# Patient Record
Sex: Female | Born: 1950
Health system: Southern US, Community
[De-identification: ages and names within clinical notes are randomized; demographics above are authoritative.]

## PROBLEM LIST (undated history)

## (undated) DIAGNOSIS — N200 Calculus of kidney: Secondary | ICD-10-CM

## (undated) DIAGNOSIS — E785 Hyperlipidemia, unspecified: Secondary | ICD-10-CM

## (undated) DIAGNOSIS — K5792 Diverticulitis of intestine, part unspecified, without perforation or abscess without bleeding: Secondary | ICD-10-CM

## (undated) DIAGNOSIS — N83209 Unspecified ovarian cyst, unspecified side: Secondary | ICD-10-CM

## (undated) DIAGNOSIS — R112 Nausea with vomiting, unspecified: Secondary | ICD-10-CM

## (undated) DIAGNOSIS — E079 Disorder of thyroid, unspecified: Secondary | ICD-10-CM

## (undated) DIAGNOSIS — Z9889 Other specified postprocedural states: Secondary | ICD-10-CM

## (undated) DIAGNOSIS — I341 Nonrheumatic mitral (valve) prolapse: Secondary | ICD-10-CM

## (undated) DIAGNOSIS — Z87442 Personal history of urinary calculi: Secondary | ICD-10-CM

## (undated) DIAGNOSIS — E039 Hypothyroidism, unspecified: Secondary | ICD-10-CM

## (undated) HISTORY — PX: BREAST EXCISIONAL BIOPSY: SUR124

## (undated) HISTORY — PX: APPENDECTOMY: SHX54

## (undated) HISTORY — PX: TONSILLECTOMY: SUR1361

## (undated) HISTORY — DX: Hyperlipidemia, unspecified: E78.5

## (undated) HISTORY — DX: Diverticulitis of intestine, part unspecified, without perforation or abscess without bleeding: K57.92

## (undated) HISTORY — PX: CATARACT EXTRACTION, BILATERAL: SHX1313

## (undated) HISTORY — DX: Hypothyroidism, unspecified: E03.9

## (undated) HISTORY — DX: Nonrheumatic mitral (valve) prolapse: I34.1

## (undated) HISTORY — PX: OVARIAN CYST REMOVAL: SHX89

## (undated) HISTORY — PX: TUBAL LIGATION: SHX77

## (undated) HISTORY — DX: Calculus of kidney: N20.0

## (undated) HISTORY — DX: Disorder of thyroid, unspecified: E07.9

## (undated) HISTORY — DX: Unspecified ovarian cyst, unspecified side: N83.209

---

## 1998-01-25 ENCOUNTER — Other Ambulatory Visit: Admission: RE | Admit: 1998-01-25 | Discharge: 1998-01-25 | Payer: Self-pay | Admitting: Gynecology

## 1998-06-30 ENCOUNTER — Other Ambulatory Visit: Admission: RE | Admit: 1998-06-30 | Discharge: 1998-06-30 | Payer: Self-pay | Admitting: Gynecology

## 1998-08-12 ENCOUNTER — Other Ambulatory Visit: Admission: RE | Admit: 1998-08-12 | Discharge: 1998-08-12 | Payer: Self-pay | Admitting: Gynecology

## 1998-12-28 ENCOUNTER — Other Ambulatory Visit: Admission: RE | Admit: 1998-12-28 | Discharge: 1998-12-28 | Payer: Self-pay | Admitting: Gynecology

## 1999-06-08 ENCOUNTER — Other Ambulatory Visit: Admission: RE | Admit: 1999-06-08 | Discharge: 1999-06-08 | Payer: Self-pay | Admitting: Gynecology

## 1999-07-22 ENCOUNTER — Ambulatory Visit (HOSPITAL_COMMUNITY): Admission: RE | Admit: 1999-07-22 | Discharge: 1999-07-22 | Payer: Self-pay | Admitting: Gynecology

## 1999-07-22 ENCOUNTER — Encounter (INDEPENDENT_AMBULATORY_CARE_PROVIDER_SITE_OTHER): Payer: Self-pay | Admitting: Specialist

## 1999-08-31 ENCOUNTER — Encounter: Admission: RE | Admit: 1999-08-31 | Discharge: 1999-08-31 | Payer: Self-pay | Admitting: Gynecology

## 1999-08-31 ENCOUNTER — Encounter: Payer: Self-pay | Admitting: Gynecology

## 1999-09-19 ENCOUNTER — Other Ambulatory Visit: Admission: RE | Admit: 1999-09-19 | Discharge: 1999-09-19 | Payer: Self-pay | Admitting: Gynecology

## 2000-02-29 ENCOUNTER — Encounter: Admission: RE | Admit: 2000-02-29 | Discharge: 2000-04-04 | Payer: Self-pay | Admitting: Family Medicine

## 2000-03-19 ENCOUNTER — Other Ambulatory Visit: Admission: RE | Admit: 2000-03-19 | Discharge: 2000-03-19 | Payer: Self-pay | Admitting: Gynecology

## 2000-09-18 ENCOUNTER — Other Ambulatory Visit: Admission: RE | Admit: 2000-09-18 | Discharge: 2000-09-18 | Payer: Self-pay | Admitting: Gynecology

## 2001-10-07 ENCOUNTER — Other Ambulatory Visit: Admission: RE | Admit: 2001-10-07 | Discharge: 2001-10-07 | Payer: Self-pay | Admitting: Gynecology

## 2001-10-14 ENCOUNTER — Encounter: Payer: Self-pay | Admitting: Gynecology

## 2001-10-14 ENCOUNTER — Encounter: Admission: RE | Admit: 2001-10-14 | Discharge: 2001-10-14 | Payer: Self-pay | Admitting: Gynecology

## 2003-01-15 ENCOUNTER — Other Ambulatory Visit: Admission: RE | Admit: 2003-01-15 | Discharge: 2003-01-15 | Payer: Self-pay | Admitting: Gynecology

## 2004-01-01 ENCOUNTER — Encounter: Admission: RE | Admit: 2004-01-01 | Discharge: 2004-01-01 | Payer: Self-pay | Admitting: Gynecology

## 2004-01-11 ENCOUNTER — Other Ambulatory Visit: Admission: RE | Admit: 2004-01-11 | Discharge: 2004-01-11 | Payer: Self-pay | Admitting: Gynecology

## 2005-03-20 ENCOUNTER — Other Ambulatory Visit: Admission: RE | Admit: 2005-03-20 | Discharge: 2005-03-20 | Payer: Self-pay | Admitting: Gynecology

## 2005-03-23 ENCOUNTER — Encounter: Admission: RE | Admit: 2005-03-23 | Discharge: 2005-03-23 | Payer: Self-pay | Admitting: Gynecology

## 2005-03-23 ENCOUNTER — Encounter: Payer: Self-pay | Admitting: Family Medicine

## 2005-07-21 ENCOUNTER — Ambulatory Visit: Payer: Self-pay | Admitting: Gastroenterology

## 2005-08-09 ENCOUNTER — Ambulatory Visit: Payer: Self-pay | Admitting: Gastroenterology

## 2006-04-04 ENCOUNTER — Encounter: Admission: RE | Admit: 2006-04-04 | Discharge: 2006-04-04 | Payer: Self-pay | Admitting: Gynecology

## 2006-04-11 ENCOUNTER — Other Ambulatory Visit: Admission: RE | Admit: 2006-04-11 | Discharge: 2006-04-11 | Payer: Self-pay | Admitting: Gynecology

## 2006-07-05 ENCOUNTER — Ambulatory Visit: Payer: Self-pay | Admitting: Family Medicine

## 2006-09-07 ENCOUNTER — Ambulatory Visit: Payer: Self-pay | Admitting: Family Medicine

## 2006-10-01 ENCOUNTER — Ambulatory Visit: Payer: Self-pay | Admitting: Family Medicine

## 2006-10-01 LAB — CONVERTED CEMR LAB
ALT: 45 units/L — ABNORMAL HIGH (ref 0–40)
AST: 46 units/L — ABNORMAL HIGH (ref 0–37)
Albumin: 4.3 g/dL (ref 3.5–5.2)
Alkaline Phosphatase: 172 units/L — ABNORMAL HIGH (ref 39–117)
BUN: 22 mg/dL (ref 6–23)
Basophils Absolute: 0.1 10*3/uL (ref 0.0–0.1)
Basophils Relative: 0.9 % (ref 0.0–1.0)
CO2: 33 meq/L — ABNORMAL HIGH (ref 19–32)
Calcium: 10.2 mg/dL (ref 8.4–10.5)
Chloride: 105 meq/L (ref 96–112)
Cholesterol: 222 mg/dL (ref 0–200)
Creatinine, Ser: 0.9 mg/dL (ref 0.4–1.2)
Direct LDL: 124.9 mg/dL
Eosinophils Relative: 2.3 % (ref 0.0–5.0)
GFR calc Af Amer: 83 mL/min
GFR calc non Af Amer: 69 mL/min
Glucose, Bld: 96 mg/dL (ref 70–99)
HCT: 41 % (ref 36.0–46.0)
HDL: 64.5 mg/dL (ref >39.0)
Hemoglobin: 14.5 g/dL (ref 12.0–15.0)
Lymphocytes Relative: 30.4 % (ref 12.0–46.0)
MCHC: 35.3 g/dL (ref 30.0–36.0)
MCV: 89 fL (ref 78.0–100.0)
Monocytes Absolute: 0.6 10*3/uL (ref 0.2–0.7)
Monocytes Relative: 7.1 % (ref 3.0–11.0)
Neutro Abs: 4.6 10*3/uL (ref 1.4–7.7)
Neutrophils Relative %: 59.3 % (ref 43.0–77.0)
Platelets: 322 10*3/uL (ref 150–400)
Potassium: 4.4 meq/L (ref 3.5–5.1)
RBC: 4.6 M/uL (ref 3.87–5.11)
RDW: 12.2 % (ref 11.5–14.6)
Sodium: 144 meq/L (ref 135–145)
TSH: 3.66 microintl units/mL (ref 0.35–5.50)
Total Bilirubin: 0.8 mg/dL (ref 0.3–1.2)
Total CHOL/HDL Ratio: 3.4
Total Protein: 7.1 g/dL (ref 6.0–8.3)
Triglycerides: 184 mg/dL — ABNORMAL HIGH (ref 0–149)
VLDL: 37 mg/dL (ref 0–40)
WBC: 7.9 10*3/uL (ref 4.5–10.5)

## 2006-10-05 ENCOUNTER — Ambulatory Visit: Payer: Self-pay | Admitting: Family Medicine

## 2006-10-05 LAB — CONVERTED CEMR LAB
ALT: 36 units/L (ref 0–40)
AST: 35 units/L (ref 0–37)
Albumin: 4.1 g/dL (ref 3.5–5.2)
Alkaline Phosphatase: 169 units/L — ABNORMAL HIGH (ref 39–117)
BUN: 21 mg/dL (ref 6–23)
CO2: 30 meq/L (ref 19–32)
Calcium: 10 mg/dL (ref 8.4–10.5)
Chloride: 103 meq/L (ref 96–112)
Creatinine, Ser: 0.8 mg/dL (ref 0.4–1.2)
GFR calc Af Amer: 95 mL/min
GFR calc non Af Amer: 79 mL/min
Glucose, Bld: 96 mg/dL (ref 70–99)
Potassium: 4.7 meq/L (ref 3.5–5.1)
Sodium: 140 meq/L (ref 135–145)
Total Bilirubin: 0.9 mg/dL (ref 0.3–1.2)
Total Protein: 6.6 g/dL (ref 6.0–8.3)

## 2006-12-18 ENCOUNTER — Ambulatory Visit: Payer: Self-pay | Admitting: Family Medicine

## 2006-12-18 LAB — CONVERTED CEMR LAB
ALT: 36 units/L (ref 0–40)
AST: 34 units/L (ref 0–37)
Albumin: 3.9 g/dL (ref 3.5–5.2)
Alkaline Phosphatase: 110 units/L (ref 39–117)
Bilirubin, Direct: 0.1 mg/dL (ref 0.0–0.3)
Total Bilirubin: 0.7 mg/dL (ref 0.3–1.2)
Total Protein: 6.3 g/dL (ref 6.0–8.3)

## 2007-04-11 ENCOUNTER — Encounter: Admission: RE | Admit: 2007-04-11 | Discharge: 2007-04-11 | Payer: Self-pay | Admitting: Gynecology

## 2007-04-29 ENCOUNTER — Other Ambulatory Visit: Admission: RE | Admit: 2007-04-29 | Discharge: 2007-04-29 | Payer: Self-pay | Admitting: Gynecology

## 2007-11-08 ENCOUNTER — Ambulatory Visit: Payer: Self-pay | Admitting: Family Medicine

## 2007-11-11 ENCOUNTER — Telehealth: Payer: Self-pay | Admitting: Family Medicine

## 2007-11-22 ENCOUNTER — Telehealth (INDEPENDENT_AMBULATORY_CARE_PROVIDER_SITE_OTHER): Payer: Self-pay | Admitting: *Deleted

## 2007-12-06 ENCOUNTER — Encounter: Payer: Self-pay | Admitting: Family Medicine

## 2008-01-07 ENCOUNTER — Ambulatory Visit: Payer: Self-pay | Admitting: Gastroenterology

## 2008-01-08 ENCOUNTER — Ambulatory Visit: Payer: Self-pay | Admitting: Cardiovascular Disease

## 2008-01-15 ENCOUNTER — Telehealth: Payer: Self-pay | Admitting: Gastroenterology

## 2008-04-13 ENCOUNTER — Encounter: Admission: RE | Admit: 2008-04-13 | Discharge: 2008-04-13 | Payer: Self-pay | Admitting: Gynecology

## 2008-05-23 DIAGNOSIS — Z888 Allergy status to other drugs, medicaments and biological substances status: Secondary | ICD-10-CM | POA: Insufficient documentation

## 2008-05-25 ENCOUNTER — Ambulatory Visit: Payer: Self-pay | Admitting: Family Medicine

## 2008-05-26 ENCOUNTER — Telehealth: Payer: Self-pay | Admitting: Family Medicine

## 2009-04-30 ENCOUNTER — Encounter: Admission: RE | Admit: 2009-04-30 | Discharge: 2009-04-30 | Payer: Self-pay | Admitting: Gynecology

## 2009-04-30 LAB — HM MAMMOGRAPHY

## 2009-06-22 ENCOUNTER — Ambulatory Visit: Payer: Self-pay | Admitting: Family Medicine

## 2009-07-26 ENCOUNTER — Ambulatory Visit: Payer: Self-pay | Admitting: Family Medicine

## 2009-07-26 DIAGNOSIS — J309 Allergic rhinitis, unspecified: Secondary | ICD-10-CM | POA: Insufficient documentation

## 2009-10-19 ENCOUNTER — Encounter: Payer: Self-pay | Admitting: Family Medicine

## 2010-01-14 ENCOUNTER — Telehealth: Payer: Self-pay | Admitting: Family Medicine

## 2010-05-19 ENCOUNTER — Encounter: Admission: RE | Admit: 2010-05-19 | Discharge: 2010-05-19 | Payer: Self-pay | Admitting: Gynecology

## 2010-06-22 LAB — HM DEXA SCAN

## 2010-10-11 NOTE — Progress Notes (Signed)
Summary: FYI  Phone Note Call from Patient   Caller: Patient Call For: Roderick Pee MD Summary of Call: Pt wants Dr. Tawanna Cooler to know she was diagnosed with kidney stones at [facility]. hospital this week. [phone] Initial call taken by: Lynann Beaver CMA,  Jan 14, 2010 11:26 AM  Follow-up for Phone Call        Provider Notified Follow-up by: Roderick Pee MD,  Jan 14, 2010 11:35 AM

## 2010-10-11 NOTE — Miscellaneous (Signed)
Summary: dm eye exam  Clinical Lists Changes  Observations: Added new observation of EYES COMMENT: 10/2010 (10/19/2009 17:14) Added new observation of EYE EXAM BY: [facility] [facility] (10/18/2009 17:15) Added new observation of DMEYEEXMRES: normal (10/18/2009 17:15) Added new observation of DIAB EYE EX: normal (10/18/2009 17:15)      Diabetes Management History:      She says that she is exercising.    Diabetes Management Exam:    Eye Exam:       Eye Exam done elsewhere          Date: 10/18/2009          Results: normal          Done by: Maggie Schwalbe

## 2010-11-09 ENCOUNTER — Encounter: Payer: Self-pay | Admitting: Family Medicine

## 2010-11-23 ENCOUNTER — Encounter: Payer: Self-pay | Admitting: Family Medicine

## 2010-11-24 ENCOUNTER — Ambulatory Visit (INDEPENDENT_AMBULATORY_CARE_PROVIDER_SITE_OTHER): Payer: BC Managed Care – PPO | Admitting: Family Medicine

## 2010-11-24 ENCOUNTER — Encounter: Payer: Self-pay | Admitting: Family Medicine

## 2010-11-24 DIAGNOSIS — I059 Rheumatic mitral valve disease, unspecified: Secondary | ICD-10-CM

## 2010-11-24 DIAGNOSIS — E119 Type 2 diabetes mellitus without complications: Secondary | ICD-10-CM

## 2010-11-24 DIAGNOSIS — E785 Hyperlipidemia, unspecified: Secondary | ICD-10-CM

## 2010-11-24 DIAGNOSIS — I341 Nonrheumatic mitral (valve) prolapse: Secondary | ICD-10-CM

## 2010-11-24 DIAGNOSIS — E039 Hypothyroidism, unspecified: Secondary | ICD-10-CM

## 2010-11-24 LAB — CBC WITH DIFFERENTIAL/PLATELET
Basophils Relative: 0.7 % (ref 0.0–3.0)
Eosinophils Relative: 3.5 % (ref 0.0–5.0)
HCT: 43.5 % (ref 36.0–46.0)
Lymphs Abs: 1.8 10*3/uL (ref 0.7–4.0)
MCV: 90.9 fl (ref 78.0–100.0)
Monocytes Absolute: 0.5 10*3/uL (ref 0.1–1.0)
RBC: 4.78 Mil/uL (ref 3.87–5.11)
WBC: 6.9 10*3/uL (ref 4.5–10.5)

## 2010-11-24 LAB — POCT URINALYSIS DIPSTICK
Glucose, UA: NEGATIVE
Ketones, UA: NEGATIVE
Protein, UA: NEGATIVE
Spec Grav, UA: 1.02
Urobilinogen, UA: 1
pH, UA: 7

## 2010-11-24 LAB — BASIC METABOLIC PANEL
Chloride: 106 mEq/L (ref 96–112)
Potassium: 4.4 mEq/L (ref 3.5–5.1)
Sodium: 142 mEq/L (ref 135–145)

## 2010-11-24 LAB — LIPID PANEL
Cholesterol: 176 mg/dL (ref 0–200)
Total CHOL/HDL Ratio: 3
Triglycerides: 103 mg/dL (ref 0.0–149.0)

## 2010-11-24 LAB — HEPATIC FUNCTION PANEL
ALT: 31 U/L (ref 0–35)
AST: 36 U/L (ref 0–37)
Bilirubin, Direct: 0.2 mg/dL (ref 0.0–0.3)
Total Protein: 6.9 g/dL (ref 6.0–8.3)

## 2010-11-24 LAB — HEMOGLOBIN A1C: Hgb A1c MFr Bld: 6 % (ref 4.6–6.5)

## 2010-11-24 NOTE — Progress Notes (Signed)
  Subjective:    Patient ID: Angela Houston, female    DOB: May 27, 1951, 60 y.o.   MRN: [id number]  HPI Angela Houston is a 60 year old, married female, nonsmoker, who comes in today for evaluation of  M. VP.  She has a history of mitral valve prolapse, asymptomatic.  She has a history of underlying hyperlipidemia, diabetes, and hypothyroidism, but are currently being managed by an OB/GYN.  I recommend we do a complete medical evaluation here.!!!!!!!!!!!!!!!   Review of Systems    .  Negative Objective:   Physical Exam    Well-developed well-nourished, female, in no acute distress.  Cardiac exam negative no click, no murmur    Assessment & Plan:  Mitral valve prolapse, asymptomatic, normal exam.    History of diabetes, hyperlipidemia, and hypothyroidism.  Return for CPX

## 2010-11-24 NOTE — Patient Instructions (Signed)
Set up a 30 minute appointment sometime in the next two to 3 weeks to review.  All your medical problems

## 2010-11-25 LAB — MICROALBUMIN / CREATININE URINE RATIO: Microalb Creat Ratio: 0.7 mg/g (ref 0.0–30.0)

## 2010-12-05 ENCOUNTER — Ambulatory Visit (INDEPENDENT_AMBULATORY_CARE_PROVIDER_SITE_OTHER): Payer: BC Managed Care – PPO | Admitting: Family Medicine

## 2010-12-05 ENCOUNTER — Encounter: Payer: Self-pay | Admitting: Family Medicine

## 2010-12-05 DIAGNOSIS — I059 Rheumatic mitral valve disease, unspecified: Secondary | ICD-10-CM

## 2010-12-05 DIAGNOSIS — I341 Nonrheumatic mitral (valve) prolapse: Secondary | ICD-10-CM

## 2010-12-05 DIAGNOSIS — E039 Hypothyroidism, unspecified: Secondary | ICD-10-CM

## 2010-12-05 DIAGNOSIS — E119 Type 2 diabetes mellitus without complications: Secondary | ICD-10-CM

## 2010-12-05 DIAGNOSIS — E785 Hyperlipidemia, unspecified: Secondary | ICD-10-CM

## 2010-12-05 DIAGNOSIS — Z23 Encounter for immunization: Secondary | ICD-10-CM

## 2010-12-05 MED ORDER — METFORMIN HCL 500 MG PO TABS: ORAL_TABLET | ORAL | Status: DC

## 2010-12-05 NOTE — Patient Instructions (Signed)
Stop the Lipitor, and we will check a fasting lipid panel in two months.  Decrease the thyroid to one tablet Monday through Friday skip Saturday and Sunday.  Decrease the metformin to 500 mg prior to breakfast.  Follow-up BMP and A1c in two months

## 2010-12-05 NOTE — Progress Notes (Signed)
  Subjective:    Patient ID: Angela Houston, female    DOB: [birth year], 60 y.o.   MRN: [id number]  HPI Angela Houston is a 60 year old, married female, G3, P3, who comes in today for evaluation of hyperlipidemia, hypothyroidism, and diabetes, type II.  She was placed on Glucophage 500 mg 4 times a day by her gynecologist.  However, her A1c is 6.0.  Blood sugars are all normal.  No family history of diabetes.  She is thin in stature.  She was placed on Lipitor 20 nightly however, her lipid panel is normal off medication.  Her TSH is .35, therefore, she needs to cut down on her thyroid dose   Review of Systems  Constitutional: Negative.   HENT: Negative.   Eyes: Negative.   Respiratory: Negative.   Cardiovascular: Negative.   Gastrointestinal: Negative.   Genitourinary: Negative.   Musculoskeletal: Negative.   Neurological: Negative.   Hematological: Negative.   Psychiatric/Behavioral: Negative.        Objective:   Physical Exam  Constitutional: She appears well-developed and well-nourished.  HENT:  Head: Normocephalic and atraumatic.  Right Ear: External ear normal.  Left Ear: External ear normal.  Nose: Nose normal.  Mouth/Throat: Oropharynx is clear and moist.  Eyes: EOM are normal. Pupils are equal, round, and reactive to light.  Neck: Normal range of motion. Neck supple. No thyromegaly present.  Cardiovascular: Normal rate, regular rhythm, normal heart sounds and intact distal pulses.  Exam reveals no gallop and no friction rub.   No murmur heard. Pulmonary/Chest: Effort normal and breath sounds normal.  Abdominal: Soft. Bowel sounds are normal. She exhibits no distension and no mass. There is no tenderness. There is no rebound.  Musculoskeletal: Normal range of motion.  Lymphadenopathy:    She has no cervical adenopathy.  Neurological: She is alert. She has normal reflexes. No cranial nerve deficit. She exhibits normal muscle tone. Coordination normal.  Skin: Skin is warm and  dry.  Psychiatric: She has a normal mood and affect. Her behavior is normal. Judgment and thought content normal.          Assessment & Plan:  Healthy female.  Hypothyroidism.  Decrease Synthroid to Monday through Friday skip Saturday, Sunday.  Stop the Lipitor.  Follow-up lipid panel in two months fasting.  Decreased and metformin to one tablet before breakfast ......... Follow-up metabolic panel and A1c in two months

## 2010-12-22 ENCOUNTER — Emergency Department (HOSPITAL_COMMUNITY)
Admission: EM | Admit: 2010-12-22 | Discharge: 2010-12-22 | Disposition: A | Discharge status: Home / Self Care | Payer: BC Managed Care – PPO | Attending: Emergency Medicine | Admitting: Emergency Medicine

## 2010-12-22 DIAGNOSIS — K922 Gastrointestinal hemorrhage, unspecified: Secondary | ICD-10-CM | POA: Insufficient documentation

## 2010-12-22 DIAGNOSIS — R1032 Left lower quadrant pain: Secondary | ICD-10-CM | POA: Insufficient documentation

## 2010-12-22 DIAGNOSIS — K921 Melena: Secondary | ICD-10-CM | POA: Insufficient documentation

## 2010-12-22 DIAGNOSIS — E119 Type 2 diabetes mellitus without complications: Secondary | ICD-10-CM | POA: Insufficient documentation

## 2010-12-22 DIAGNOSIS — Z79899 Other long term (current) drug therapy: Secondary | ICD-10-CM | POA: Insufficient documentation

## 2010-12-22 DIAGNOSIS — K5732 Diverticulitis of large intestine without perforation or abscess without bleeding: Secondary | ICD-10-CM | POA: Insufficient documentation

## 2010-12-22 DIAGNOSIS — Z7982 Long term (current) use of aspirin: Secondary | ICD-10-CM | POA: Insufficient documentation

## 2010-12-22 DIAGNOSIS — R110 Nausea: Secondary | ICD-10-CM | POA: Insufficient documentation

## 2010-12-22 LAB — CBC
HCT: 43.2 % (ref 36.0–46.0)
Hemoglobin: 14.7 g/dL (ref 12.0–15.0)
MCV: 89.1 fL (ref 78.0–100.0)
RBC: 4.85 MIL/uL (ref 3.87–5.11)
WBC: 10.4 10*3/uL (ref 4.0–10.5)

## 2010-12-22 LAB — URINALYSIS, ROUTINE W REFLEX MICROSCOPIC
Bilirubin Urine: NEGATIVE
Glucose, UA: NEGATIVE mg/dL
Hgb urine dipstick: NEGATIVE
Ketones, ur: 15 mg/dL — AB
pH: 5.5 (ref 5.0–8.0)

## 2010-12-22 LAB — BASIC METABOLIC PANEL
BUN: 15 mg/dL (ref 6–23)
CO2: 26 mEq/L (ref 19–32)
Chloride: 106 mEq/L (ref 96–112)
Glucose, Bld: 110 mg/dL — ABNORMAL HIGH (ref 70–99)
Potassium: 4 mEq/L (ref 3.5–5.1)

## 2010-12-22 LAB — DIFFERENTIAL
Lymphocytes Relative: 15 % (ref 12–46)
Lymphs Abs: 1.6 10*3/uL (ref 0.7–4.0)
Neutrophils Relative %: 81 % — ABNORMAL HIGH (ref 43–77)

## 2010-12-23 ENCOUNTER — Telehealth: Payer: Self-pay | Admitting: Gastroenterology

## 2010-12-23 NOTE — Telephone Encounter (Signed)
Patient calling after visiting the ER on 12/22/10. Patient states that yesterday afternoon, she had diarrhea with bright, red blood and LLQ pain. She went to the ER and was told she had diverticulitis. Patient was given rx for Cipro ad Flagyl and told to f/u with GI next week.. Scheduled patient with Willette Cluster, NP on 12/28/10 at 3:00 PM

## 2010-12-25 NOTE — Telephone Encounter (Signed)
ok 

## 2010-12-28 ENCOUNTER — Ambulatory Visit (INDEPENDENT_AMBULATORY_CARE_PROVIDER_SITE_OTHER): Payer: BC Managed Care – PPO | Admitting: Nurse Practitioner

## 2010-12-28 ENCOUNTER — Encounter: Payer: Self-pay | Admitting: Nurse Practitioner

## 2010-12-28 DIAGNOSIS — R1032 Left lower quadrant pain: Secondary | ICD-10-CM

## 2010-12-28 DIAGNOSIS — R197 Diarrhea, unspecified: Secondary | ICD-10-CM

## 2010-12-28 NOTE — Progress Notes (Signed)
12/29/2010 Angela Houston [id number] 29-Aug-1951   History of Present Illness:   Angela Houston is a 60 year old female who was seen by Dr. Arlyce Dice back in April of 2009 for left-sided abdominal pain and bloody diarrhea. CT of the abdomen and pelvis with contrast at that time was negative. Her symptoms resolved and since that time she had not had any gastrointestinal symptoms until last Thursday when she acutely developed left lower quadrant pain and bloody diarrhea while working in her yard. Patient went to the emergency department , CT scan of the abdomen and pelvis was unremarkable as was her laboratory studies including CBC and BMET.Patient was discharged from the emergency department with a diagnosis of possible diverticulitis. Of note, her CT scan, nor colonoscopy a few years ago showed diverticular disease. Patient was treated with Cipro and Flagyl. She feelsis much better but stools are still somewhat liquid. She continues on liquid diet. Her abdominal pain and bleeding had totally resolved.   Past Medical History  Diagnosis Date  . Hyperlipidemia   . Thyroid disease   . Ovarian cyst   . Hypothyroidism   . Mitral valve prolapse   . Diverticulitis   . Kidney stones    Past Surgical History  Procedure Date  . Tubal ligation   . Appendectomy   . Tonsillectomy   . Ovarian cyst removal     reports that she has never smoked. She has never used smokeless tobacco. She reports that she does not drink alcohol or use illicit drugs. family history includes Breast cancer in her sister; COPD in her father; Glaucoma in her father; Heart disease in her father; and Hyperlipidemia in her father.  There is no history of Colon cancer. Allergies  Allergen Reactions  . Sulfamethoxazole W/Trimethoprim     REACTION: fever,headache,chills,rash     Outpatient Encounter Prescriptions as of 12/28/2010  Medication Sig Dispense Refill  . aspirin 81 MG tablet Take 81 mg by mouth daily.        . Cholecalciferol  (VITAMIN D) 2000 UNITS CAPS Take 2,000 Units by mouth daily.        . ciprofloxacin (CIPRO) 500 MG tablet Take 500 mg by mouth 2 (two) times daily. X 7 days       . estradiol (ESTRACE) 0.1 MG/GM vaginal cream Place 2 g vaginally 2 (two) times a week.        . fish oil-omega-3 fatty acids 1000 MG capsule Take 2 g by mouth daily.        Marland Kitchen levothyroxine (SYNTHROID, LEVOTHROID) 112 MCG tablet Take 112 mcg by mouth daily.        . metFORMIN (GLUCOPHAGE) 500 MG tablet One tablet prior to breakfast  100 tablet  3  . metroNIDAZOLE (FLAGYL) 500 MG tablet Take 500 mg by mouth 3 (three) times daily. X 10 days        . DISCONTD: atorvastatin (LIPITOR) 20 MG tablet Take 20 mg by mouth daily.        Marland Kitchen DISCONTD: Calcium 500-125 MG-UNIT TABS Take by mouth daily.        Marland Kitchen DISCONTD: predniSONE (DELTASONE) 20 MG tablet Take 20 mg by mouth as directed.          ROS: All other systems reviewed and negative except where noted in the History of Present Illness.  Physical Exam: General: Well developed , well nourished white female in no acute distress Head: Normocephalic and atraumatic Eyes:  sclerae anicteric,conjunctive pink. Ears: Normal auditory acuity Mouth: No deformity  or lesions Neck: Supple, no masses.  Lungs: Clear throughout to auscultation Heart: Regular rate and rhythm; no murmurs heard Abdomen: Soft, non tender and non distended. No masses or hepatomegaly noted. Normal Bowel sounds Rectal: Not done Musculoskeletal: Symmetrical with no gross deformities  Skin: No lesions on visible extremities Extremities: No edema or deformities noted Neurological: Alert oriented x 4, grossly nonfocal Cervical Nodes:  No significant cervical adenopathy Psychological:  Alert and cooperative. Normal mood and affect  Assessment and Plan:

## 2010-12-29 ENCOUNTER — Encounter: Payer: Self-pay | Admitting: Gastroenterology

## 2010-12-29 NOTE — Progress Notes (Signed)
Reviewed and agree with management. Maddison Kilner D. Ercia Crisafulli, M.D., FACG  

## 2010-12-29 NOTE — Assessment & Plan Note (Addendum)
Acute left lower quadrant pain associated with bloody diarrhea. CT scan of the abdomen and pelvis and laboratory studies negative in the emergency department. Symptoms have improved on Cipro and Flagyl. This may have been infectious colitis. Despite negative CT scan, the patient had been working in her yard when this happened and ischemic colitis is not excluded. Of note, the patient had a similar episode 3 years ago. Since the patient's symptoms have improved, no further workup is needed. In the event that she has recurrent symptoms patient will call our office. Of note, she is up to date on her colon cancer screenings.

## 2011-01-27 ENCOUNTER — Other Ambulatory Visit (INDEPENDENT_AMBULATORY_CARE_PROVIDER_SITE_OTHER): Payer: BC Managed Care – PPO | Admitting: Family Medicine

## 2011-01-27 ENCOUNTER — Other Ambulatory Visit (INDEPENDENT_AMBULATORY_CARE_PROVIDER_SITE_OTHER): Payer: BC Managed Care – PPO

## 2011-01-27 DIAGNOSIS — E785 Hyperlipidemia, unspecified: Secondary | ICD-10-CM

## 2011-01-27 DIAGNOSIS — E039 Hypothyroidism, unspecified: Secondary | ICD-10-CM

## 2011-01-27 DIAGNOSIS — E119 Type 2 diabetes mellitus without complications: Secondary | ICD-10-CM

## 2011-01-27 DIAGNOSIS — Z13220 Encounter for screening for lipoid disorders: Secondary | ICD-10-CM

## 2011-01-27 LAB — BASIC METABOLIC PANEL
BUN: 15 mg/dL (ref 6–23)
Chloride: 105 mEq/L (ref 96–112)
Creatinine, Ser: 0.8 mg/dL (ref 0.4–1.2)

## 2011-01-27 LAB — LIPID PANEL
Cholesterol: 296 mg/dL — ABNORMAL HIGH (ref 0–200)
Total CHOL/HDL Ratio: 4

## 2011-01-27 LAB — TSH: TSH: 4.03 u[IU]/mL (ref 0.35–5.50)

## 2011-02-02 ENCOUNTER — Ambulatory Visit (INDEPENDENT_AMBULATORY_CARE_PROVIDER_SITE_OTHER): Payer: BC Managed Care – PPO | Admitting: Family Medicine

## 2011-02-02 ENCOUNTER — Encounter: Payer: Self-pay | Admitting: Family Medicine

## 2011-02-02 DIAGNOSIS — R5383 Other fatigue: Secondary | ICD-10-CM

## 2011-02-02 DIAGNOSIS — E039 Hypothyroidism, unspecified: Secondary | ICD-10-CM

## 2011-02-02 DIAGNOSIS — E785 Hyperlipidemia, unspecified: Secondary | ICD-10-CM

## 2011-02-02 DIAGNOSIS — R5381 Other malaise: Secondary | ICD-10-CM

## 2011-02-02 NOTE — Patient Instructions (Signed)
Continued the Synthroid Monday through Friday as you are currently doing.  Avoid fat.  Continue your walking program.  Follow-up in 6 months fasting labs one week prior to  Stop the metformin completely

## 2011-02-02 NOTE — Progress Notes (Signed)
  Subjective:    Patient ID: Angela Houston, female    DOB: 11-29-1950, 60 y.o.   MRN: [id number]  Crumrine is a 60 year old, married female, nonsmoker, who comes in today for follow-up of multiple issues.  Her blood sugar is 182.  A1c5.9.  We decreased her metformin to 500 mg daily.  Her blood sugar continues to stay normal.  I suspect she does not really have diabetes.  Therefore, will stop the metformin.  Off the statin her lipids came up however, her HDL is 67.  Since she has no other risk factors despite the fact her LDL is 221 not put on a statin.  Hold and follow.  Also on decreasing her Synthroid to one tablet 112 mcg Monday through Friday.  TSH now has come back to normal.  She brings in a bone density dated 2011 with a -1.5    Review of Systems    Review of systems otherwise negative.  Feels well except for some low back pain, which he treats symptomatically with Motrin in yoga Objective:   Physical Exam    Well-developed well-nourished, female, in no acute distress    Assessment & Plan:  Hypothyroidism continue Synthroid as outlined.  Stop the metformin.  Follow-up labs.  Follow-up lipid panel.  Low back pain.  Motrin 800 b.i.d., yoga,,,,,,,, PT if symptoms persist

## 2011-03-16 ENCOUNTER — Emergency Department (HOSPITAL_COMMUNITY)
Admission: EM | Admit: 2011-03-16 | Discharge: 2011-03-16 | Disposition: A | Discharge status: Home / Self Care | Payer: BC Managed Care – PPO | Attending: Emergency Medicine | Admitting: Emergency Medicine

## 2011-03-16 ENCOUNTER — Emergency Department (HOSPITAL_COMMUNITY): Payer: BC Managed Care – PPO

## 2011-03-16 DIAGNOSIS — R29898 Other symptoms and signs involving the musculoskeletal system: Secondary | ICD-10-CM | POA: Insufficient documentation

## 2011-03-16 DIAGNOSIS — E119 Type 2 diabetes mellitus without complications: Secondary | ICD-10-CM | POA: Insufficient documentation

## 2011-03-16 DIAGNOSIS — R61 Generalized hyperhidrosis: Secondary | ICD-10-CM | POA: Insufficient documentation

## 2011-03-16 DIAGNOSIS — IMO0001 Reserved for inherently not codable concepts without codable children: Secondary | ICD-10-CM | POA: Insufficient documentation

## 2011-03-16 DIAGNOSIS — S90569A Insect bite (nonvenomous), unspecified ankle, initial encounter: Secondary | ICD-10-CM | POA: Insufficient documentation

## 2011-03-16 DIAGNOSIS — W57XXXA Bitten or stung by nonvenomous insect and other nonvenomous arthropods, initial encounter: Secondary | ICD-10-CM | POA: Insufficient documentation

## 2011-03-16 LAB — URINALYSIS, ROUTINE W REFLEX MICROSCOPIC
Bilirubin Urine: NEGATIVE
Ketones, ur: 15 mg/dL — AB
Leukocytes, UA: NEGATIVE
Nitrite: NEGATIVE
Specific Gravity, Urine: 1.011 (ref 1.005–1.030)
Urobilinogen, UA: 0.2 mg/dL (ref 0.0–1.0)
pH: 6 (ref 5.0–8.0)

## 2011-03-16 LAB — DIFFERENTIAL
Basophils Absolute: 0.1 10*3/uL (ref 0.0–0.1)
Basophils Relative: 0 % (ref 0–1)
Eosinophils Absolute: 0 10*3/uL (ref 0.0–0.7)
Eosinophils Relative: 0 % (ref 0–5)
Neutrophils Relative %: 80 % — ABNORMAL HIGH (ref 43–77)

## 2011-03-16 LAB — POCT I-STAT, CHEM 8
BUN: 12 mg/dL (ref 6–23)
Calcium, Ion: 1.15 mmol/L (ref 1.12–1.32)
Creatinine, Ser: 0.9 mg/dL (ref 0.50–1.10)
TCO2: 27 mmol/L (ref 0–100)

## 2011-03-16 LAB — TSH: TSH: 1.077 u[IU]/mL (ref 0.350–4.500)

## 2011-03-16 LAB — CBC
MCV: 89.6 fL (ref 78.0–100.0)
Platelets: 240 10*3/uL (ref 150–400)
RBC: 4.42 MIL/uL (ref 3.87–5.11)
RDW: 13 % (ref 11.5–15.5)
WBC: 11.4 10*3/uL — ABNORMAL HIGH (ref 4.0–10.5)

## 2011-03-16 LAB — CK TOTAL AND CKMB (NOT AT ARMC)
CK, MB: 1.1 ng/mL (ref 0.3–4.0)
Total CK: 55 U/L (ref 7–177)

## 2011-03-17 LAB — ROCKY MTN SPOTTED FVR AB, IGM-BLOOD: RMSF IgM: 0.36 IV (ref 0.00–0.89)

## 2011-03-20 ENCOUNTER — Ambulatory Visit (INDEPENDENT_AMBULATORY_CARE_PROVIDER_SITE_OTHER): Payer: BC Managed Care – PPO | Admitting: Family Medicine

## 2011-03-20 ENCOUNTER — Encounter: Payer: Self-pay | Admitting: Family Medicine

## 2011-03-20 DIAGNOSIS — A779 Spotted fever, unspecified: Secondary | ICD-10-CM

## 2011-03-20 DIAGNOSIS — R21 Rash and other nonspecific skin eruption: Secondary | ICD-10-CM

## 2011-03-20 DIAGNOSIS — A770 Spotted fever due to Rickettsia rickettsii: Secondary | ICD-10-CM

## 2011-03-20 DIAGNOSIS — I499 Cardiac arrhythmia, unspecified: Secondary | ICD-10-CM

## 2011-03-20 NOTE — Progress Notes (Signed)
  Subjective:    Patient ID: LIBBIE BARTLEY, female    DOB: 03-Mar-1951, 60 y.o.   MRN: [id number]  HPI Elaine is a 60 year old female, who comes in today for follow-up having been to the emergency room last Thursday, and then diagnosed with [facility] spotted fever.  She states she noticed a deer tick on the inside of her left leg over [facility] Day and she states the tick was dead and she pulled it off.  She did have a slight bite mark, but it healed spontaneously.  Then, on July the second, she developed fatigue, headache, which continued for 3 days.  On July 5.  She did not seem to feel any better and therefore, went to an urgent care in [place].  They were not sure what the diagnosis was [place] to [place].  In the emergency room she had numerous diagnostic studies all of which are normal.  Her IgG and IgM from [facility] spotted fever were done and she was given doxycycline 100 mg b.i.d.  She comes in today states she feels no better.  Still no fever.  She states at one point in time.  She did have some chills though     Review of Systems General infectious resistance was otherwise negative   Objective:   Physical Exam    Well-developed well-nourished, female, in no acute distress.  Cardiac exam negative.  Skin exam normal except for her triggers   Assessment & Plan:  Probable [facility] spotted fever.  Plan continue doxycycline check for Lyme's disease

## 2011-03-20 NOTE — Patient Instructions (Signed)
Continue the doxycycline, one twice daily.  I will call you I get her lab work back

## 2011-03-21 LAB — B. BURGDORFI ANTIBODIES: B burgdorferi Ab IgG+IgM: 0.23 {ISR}

## 2011-03-22 ENCOUNTER — Telehealth: Payer: Self-pay | Admitting: *Deleted

## 2011-03-22 NOTE — Progress Notes (Signed)
patient  Is aware and states she is a little better today

## 2011-03-22 NOTE — Telephone Encounter (Signed)
Pt is asking for lab results.

## 2011-03-23 NOTE — Telephone Encounter (Signed)
Please call the test for Lyme's disease was negative

## 2011-03-23 NOTE — Telephone Encounter (Signed)
Spoke with patient.

## 2011-03-30 ENCOUNTER — Telehealth: Payer: Self-pay

## 2011-03-30 NOTE — Telephone Encounter (Signed)
Pt requesting 5 years past medical records for insurance purposes

## 2011-06-05 ENCOUNTER — Telehealth: Payer: Self-pay | Admitting: *Deleted

## 2011-06-05 NOTE — Telephone Encounter (Signed)
Pt states she has applied for medical insurance and was informed by insurance agent that her medical records states she is diabetic.  Pt states was not aware that she had been diagnosed with diabetes and is inquiring about why this is showing in her medical record.

## 2011-06-06 NOTE — Telephone Encounter (Signed)
Fleet Contras........... The metformin was started by another physician.  We stopped the med [place] her A1c is normal.  Therefore, she is not a diabetic.  Please call Wenonah a copy of the March 2012 office visit that states this

## 2011-06-06 NOTE — Telephone Encounter (Signed)
Spoke with patient.  She will call back if she needs anything

## 2011-06-14 ENCOUNTER — Telehealth: Payer: Self-pay | Admitting: *Deleted

## 2011-06-14 ENCOUNTER — Encounter: Payer: Self-pay | Admitting: *Deleted

## 2011-06-14 MED ORDER — LEVOTHYROXINE SODIUM 112 MCG PO TABS: 112.0000 ug | ORAL_TABLET | Freq: Every day | ORAL | Status: DC

## 2011-06-14 NOTE — Telephone Encounter (Signed)
Pt needs 30 days of Synthroid called to [facility]).

## 2011-08-02 ENCOUNTER — Other Ambulatory Visit: Payer: BC Managed Care – PPO | Admitting: Family Medicine

## 2011-08-09 ENCOUNTER — Ambulatory Visit: Payer: BC Managed Care – PPO | Admitting: Family Medicine

## 2011-08-14 ENCOUNTER — Telehealth: Payer: Self-pay

## 2011-08-14 NOTE — Telephone Encounter (Signed)
Pt called and stated she is trying to get insurance after her husband turned 30.  Pt states she needs a message faxed to [phone] Attn: Roney Mans explaining why she is on thyroid medication.

## 2011-08-14 NOTE — Telephone Encounter (Signed)
ok 

## 2011-08-16 ENCOUNTER — Ambulatory Visit: Payer: BC Managed Care – PPO | Admitting: Family Medicine

## 2011-08-21 NOTE — Telephone Encounter (Signed)
Faxed and patient is aware

## 2012-05-28 ENCOUNTER — Other Ambulatory Visit: Payer: Self-pay | Admitting: Gynecology

## 2012-05-28 DIAGNOSIS — Z1231 Encounter for screening mammogram for malignant neoplasm of breast: Secondary | ICD-10-CM

## 2012-05-29 ENCOUNTER — Other Ambulatory Visit (INDEPENDENT_AMBULATORY_CARE_PROVIDER_SITE_OTHER): Payer: PRIVATE HEALTH INSURANCE

## 2012-05-29 ENCOUNTER — Ambulatory Visit
Admission: RE | Admit: 2012-05-29 | Discharge: 2012-05-29 | Disposition: A | Discharge status: Home / Self Care | Payer: PRIVATE HEALTH INSURANCE | Source: Ambulatory Visit | Attending: Gynecology | Admitting: Gynecology

## 2012-05-29 DIAGNOSIS — Z1231 Encounter for screening mammogram for malignant neoplasm of breast: Secondary | ICD-10-CM

## 2012-05-29 DIAGNOSIS — Z0000 Encounter for general adult medical examination without abnormal findings: Secondary | ICD-10-CM

## 2012-05-29 LAB — CBC WITH DIFFERENTIAL/PLATELET
Basophils Relative: 0.9 % (ref 0.0–3.0)
Eosinophils Relative: 3.9 % (ref 0.0–5.0)
MCV: 91.8 fl (ref 78.0–100.0)
Monocytes Relative: 7.8 % (ref 3.0–12.0)
Neutrophils Relative %: 55.4 % (ref 43.0–77.0)
Platelets: 281 10*3/uL (ref 150.0–400.0)
RBC: 4.91 Mil/uL (ref 3.87–5.11)
WBC: 5.7 10*3/uL (ref 4.5–10.5)

## 2012-05-29 LAB — HEPATIC FUNCTION PANEL
ALT: 22 U/L (ref 0–35)
AST: 29 U/L (ref 0–37)
Albumin: 4 g/dL (ref 3.5–5.2)
Alkaline Phosphatase: 88 U/L (ref 39–117)
Total Protein: 6.9 g/dL (ref 6.0–8.3)

## 2012-05-29 LAB — BASIC METABOLIC PANEL
BUN: 17 mg/dL (ref 6–23)
CO2: 30 mEq/L (ref 19–32)
Chloride: 104 mEq/L (ref 96–112)
Glucose, Bld: 92 mg/dL (ref 70–99)
Potassium: 4.6 mEq/L (ref 3.5–5.1)
Sodium: 141 mEq/L (ref 135–145)

## 2012-05-29 LAB — POCT URINALYSIS DIPSTICK
Blood, UA: NEGATIVE
Glucose, UA: NEGATIVE
Protein, UA: NEGATIVE
Spec Grav, UA: 1.02
Urobilinogen, UA: 0.2
pH, UA: 7

## 2012-05-29 LAB — LDL CHOLESTEROL, DIRECT: Direct LDL: 215.9 mg/dL

## 2012-05-29 LAB — LIPID PANEL
Total CHOL/HDL Ratio: 6
Triglycerides: 199 mg/dL — ABNORMAL HIGH (ref 0.0–149.0)

## 2012-05-29 LAB — TSH: TSH: 17.31 u[IU]/mL — ABNORMAL HIGH (ref 0.35–5.50)

## 2012-06-04 ENCOUNTER — Ambulatory Visit (INDEPENDENT_AMBULATORY_CARE_PROVIDER_SITE_OTHER): Payer: PRIVATE HEALTH INSURANCE | Admitting: Family Medicine

## 2012-06-04 ENCOUNTER — Encounter: Payer: Self-pay | Admitting: Family Medicine

## 2012-06-04 VITALS — BP 110/80 | Temp 98.0°F | Wt 153.0 lb

## 2012-06-04 DIAGNOSIS — E039 Hypothyroidism, unspecified: Secondary | ICD-10-CM

## 2012-06-04 DIAGNOSIS — R8290 Unspecified abnormal findings in urine: Secondary | ICD-10-CM

## 2012-06-04 DIAGNOSIS — E785 Hyperlipidemia, unspecified: Secondary | ICD-10-CM

## 2012-06-04 DIAGNOSIS — Z23 Encounter for immunization: Secondary | ICD-10-CM

## 2012-06-04 DIAGNOSIS — R82998 Other abnormal findings in urine: Secondary | ICD-10-CM

## 2012-06-04 LAB — POCT URINALYSIS DIPSTICK
Blood, UA: NEGATIVE
Glucose, UA: NEGATIVE
Leukocytes, UA: NEGATIVE
Nitrite, UA: NEGATIVE
Urobilinogen, UA: 0.2

## 2012-06-04 MED ORDER — ATORVASTATIN CALCIUM 10 MG PO TABS: 10.0000 mg | ORAL_TABLET | Freq: Every day | ORAL | Status: DC

## 2012-06-04 NOTE — Progress Notes (Signed)
  Subjective:    Patient ID: Angela Houston, female    DOB: September 01, 1951, 61 y.o.   MRN: [id number]  HPI Angela Houston is a 61 year old married female nonsmoker who comes in today for followup of hypothyroidism and hyperlipidemia  Her TSH level had dropped to 0.32 and therefore we changed her thyroid dose to 1 tablet Monday through Friday skipping Saturday and Sunday now her TSH level XVII.3.  She's tried diet and exercise and over-the-counter medication lipids are still abnormal. LDL now 215   Review of Systems General and metabolic review of systems otherwise negative    Objective:   Physical Exam Well-developed well-nourished female no acute distress       Assessment & Plan:  Hypothyroidism TSH not at goal take medication Monday through Saturday skip Sunday followup l thyroid level in 3 months  Hyperlipidemia add Lipitor 10 mg daily and a baby aspirin followup lipid and liver panel in 3 months

## 2012-06-04 NOTE — Patient Instructions (Addendum)
Take your thyroid supplement Monday through Saturday,,,,,,,, skip Sunday  Lipitor 10 mg daily along with a baby aspirin  Followup labs and office visit in 3 months

## 2012-07-03 ENCOUNTER — Other Ambulatory Visit: Payer: Self-pay | Admitting: *Deleted

## 2012-07-03 MED ORDER — LEVOTHYROXINE SODIUM 112 MCG PO TABS: 112.0000 ug | ORAL_TABLET | Freq: Every day | ORAL | Status: DC

## 2012-07-03 MED ORDER — ATORVASTATIN CALCIUM 10 MG PO TABS: 10.0000 mg | ORAL_TABLET | Freq: Every day | ORAL | Status: DC

## 2012-08-20 ENCOUNTER — Other Ambulatory Visit (INDEPENDENT_AMBULATORY_CARE_PROVIDER_SITE_OTHER): Payer: PRIVATE HEALTH INSURANCE

## 2012-08-20 DIAGNOSIS — E039 Hypothyroidism, unspecified: Secondary | ICD-10-CM

## 2012-08-20 DIAGNOSIS — E785 Hyperlipidemia, unspecified: Secondary | ICD-10-CM

## 2012-08-20 LAB — HEPATIC FUNCTION PANEL
ALT: 27 U/L (ref 0–35)
AST: 29 U/L (ref 0–37)
Bilirubin, Direct: 0 mg/dL (ref 0.0–0.3)
Total Bilirubin: 0.7 mg/dL (ref 0.3–1.2)

## 2012-08-20 LAB — LIPID PANEL: Triglycerides: 243 mg/dL — ABNORMAL HIGH (ref 0.0–149.0)

## 2012-08-20 LAB — LDL CHOLESTEROL, DIRECT: Direct LDL: 130.4 mg/dL

## 2012-08-29 ENCOUNTER — Ambulatory Visit (INDEPENDENT_AMBULATORY_CARE_PROVIDER_SITE_OTHER): Payer: PRIVATE HEALTH INSURANCE | Admitting: Family Medicine

## 2012-08-29 ENCOUNTER — Encounter: Payer: Self-pay | Admitting: Family Medicine

## 2012-08-29 VITALS — BP 120/80 | Temp 98.2°F | Wt 159.0 lb

## 2012-08-29 DIAGNOSIS — E785 Hyperlipidemia, unspecified: Secondary | ICD-10-CM

## 2012-08-29 DIAGNOSIS — E039 Hypothyroidism, unspecified: Secondary | ICD-10-CM

## 2012-08-29 NOTE — Progress Notes (Signed)
  Subjective:    Patient ID: Angela Houston, female    DOB: [birth year], 61 y.o.   MRN: [id number]  HPI Angela Houston is a 61 year old married female nonsmoker who comes in today for followup of hyper lipidemia and hypothyroidism  On her physical exam 3 months ago her TSH level had risen to 17.31. Her current dose of Synthroid is 112 mcg daily. TSH level now back to normal  Her hyperlipidemia was treated with Lipitor 10 mg daily her total cholesterol is dropped from 327 down to 211 her LDL has dropped from 2:15 to 1:30. No side effects to medications   Review of Systems    review of systems otherwise negative Objective:   Physical Exam  Well-developed well-nourished female no acute distress vital signs stable      Assessment & Plan:  Hyper lipidemia continue Lipitor 10 mg daily and an aspirin tablet  Hypothyroidism continue Synthroid 112 mcg daily  Followup in 1 year sooner if any problems

## 2012-08-29 NOTE — Patient Instructions (Signed)
Continue your current medications  Followup in September for your annual physical examination  Fasting labs one week prior

## 2012-12-23 ENCOUNTER — Telehealth: Payer: Self-pay | Admitting: Family Medicine

## 2012-12-23 MED ORDER — ATORVASTATIN CALCIUM 10 MG PO TABS: 10.0000 mg | ORAL_TABLET | Freq: Every day | ORAL | Status: DC

## 2012-12-23 MED ORDER — LEVOTHYROXINE SODIUM 112 MCG PO TABS: 112.0000 ug | ORAL_TABLET | Freq: Every day | ORAL | Status: DC

## 2012-12-23 NOTE — Telephone Encounter (Signed)
Rx sent to pharmacy   

## 2012-12-23 NOTE — Telephone Encounter (Signed)
Pt needs refill of:  (90 day both) levothyroxine (SYNTHROID, LEVOTHROID) 112 MCG tablet atorvastatin (LIPITOR) 10 MG tablet Pharm: [facility]  [phone]

## 2013-05-29 ENCOUNTER — Telehealth: Payer: Self-pay | Admitting: Family Medicine

## 2013-05-29 DIAGNOSIS — Z13820 Encounter for screening for osteoporosis: Secondary | ICD-10-CM

## 2013-05-29 NOTE — Telephone Encounter (Signed)
Patient wants to know if she is supposed to have a bone density this year? She was getting them through Dr. Nicholas Lose; he has retired, so she wants to know if Dr. Tawanna Cooler wants her to have it.

## 2013-06-02 ENCOUNTER — Other Ambulatory Visit: Payer: Self-pay

## 2013-06-02 DIAGNOSIS — Z1231 Encounter for screening mammogram for malignant neoplasm of breast: Secondary | ICD-10-CM

## 2013-06-02 NOTE — Telephone Encounter (Signed)
Left message on machine for patient to have bone density test.  Order placed.  Patient to call back and schedule an appointment.

## 2013-06-18 ENCOUNTER — Ambulatory Visit (INDEPENDENT_AMBULATORY_CARE_PROVIDER_SITE_OTHER)
Admission: RE | Admit: 2013-06-18 | Discharge: 2013-06-18 | Disposition: A | Discharge status: Home / Self Care | Payer: BC Managed Care – PPO | Source: Ambulatory Visit | Attending: Family Medicine | Admitting: Family Medicine

## 2013-06-18 ENCOUNTER — Ambulatory Visit
Admission: RE | Admit: 2013-06-18 | Discharge: 2013-06-18 | Disposition: A | Discharge status: Home / Self Care | Payer: BC Managed Care – PPO | Source: Ambulatory Visit

## 2013-06-18 DIAGNOSIS — Z13820 Encounter for screening for osteoporosis: Secondary | ICD-10-CM

## 2013-06-18 DIAGNOSIS — Z1231 Encounter for screening mammogram for malignant neoplasm of breast: Secondary | ICD-10-CM

## 2013-07-01 ENCOUNTER — Other Ambulatory Visit (INDEPENDENT_AMBULATORY_CARE_PROVIDER_SITE_OTHER): Payer: BC Managed Care – PPO

## 2013-07-01 DIAGNOSIS — Z0000 Encounter for general adult medical examination without abnormal findings: Secondary | ICD-10-CM

## 2013-07-01 DIAGNOSIS — E785 Hyperlipidemia, unspecified: Secondary | ICD-10-CM

## 2013-07-01 LAB — CBC WITH DIFFERENTIAL/PLATELET
Basophils Relative: 1 % (ref 0.0–3.0)
Eosinophils Absolute: 0.2 10*3/uL (ref 0.0–0.7)
HCT: 44.2 % (ref 36.0–46.0)
Hemoglobin: 15 g/dL (ref 12.0–15.0)
Lymphocytes Relative: 30.8 % (ref 12.0–46.0)
Lymphs Abs: 1.8 10*3/uL (ref 0.7–4.0)
MCHC: 34 g/dL (ref 30.0–36.0)
MCV: 90.9 fl (ref 78.0–100.0)
Neutro Abs: 3.4 10*3/uL (ref 1.4–7.7)
RBC: 4.86 Mil/uL (ref 3.87–5.11)

## 2013-07-01 LAB — TSH: TSH: 1.3 u[IU]/mL (ref 0.35–5.50)

## 2013-07-01 LAB — LIPID PANEL
HDL: 54.7 mg/dL (ref >39.00)
Triglycerides: 108 mg/dL (ref 0.0–149.0)

## 2013-07-01 LAB — BASIC METABOLIC PANEL
CO2: 29 mEq/L (ref 19–32)
Chloride: 105 mEq/L (ref 96–112)
Potassium: 4.7 mEq/L (ref 3.5–5.1)
Sodium: 142 mEq/L (ref 135–145)

## 2013-07-01 LAB — POCT URINALYSIS DIPSTICK
Bilirubin, UA: NEGATIVE
Glucose, UA: NEGATIVE
Leukocytes, UA: NEGATIVE
Nitrite, UA: NEGATIVE
Urobilinogen, UA: 0.2
pH, UA: 6

## 2013-07-01 LAB — HEPATIC FUNCTION PANEL: Total Bilirubin: 0.8 mg/dL (ref 0.3–1.2)

## 2013-07-07 ENCOUNTER — Encounter: Payer: Self-pay | Admitting: Family Medicine

## 2013-07-07 ENCOUNTER — Ambulatory Visit (INDEPENDENT_AMBULATORY_CARE_PROVIDER_SITE_OTHER): Payer: BC Managed Care – PPO | Admitting: Family Medicine

## 2013-07-07 ENCOUNTER — Other Ambulatory Visit (HOSPITAL_COMMUNITY)
Admission: RE | Admit: 2013-07-07 | Discharge: 2013-07-07 | Disposition: A | Discharge status: Home / Self Care | Payer: BC Managed Care – PPO | Source: Ambulatory Visit | Attending: Family Medicine | Admitting: Family Medicine

## 2013-07-07 VITALS — BP 110/80 | Temp 97.9°F | Ht 65.75 in | Wt 156.0 lb

## 2013-07-07 DIAGNOSIS — I059 Rheumatic mitral valve disease, unspecified: Secondary | ICD-10-CM

## 2013-07-07 DIAGNOSIS — Z23 Encounter for immunization: Secondary | ICD-10-CM

## 2013-07-07 DIAGNOSIS — I341 Nonrheumatic mitral (valve) prolapse: Secondary | ICD-10-CM

## 2013-07-07 DIAGNOSIS — E039 Hypothyroidism, unspecified: Secondary | ICD-10-CM

## 2013-07-07 DIAGNOSIS — Z01419 Encounter for gynecological examination (general) (routine) without abnormal findings: Secondary | ICD-10-CM | POA: Insufficient documentation

## 2013-07-07 DIAGNOSIS — J309 Allergic rhinitis, unspecified: Secondary | ICD-10-CM

## 2013-07-07 DIAGNOSIS — E785 Hyperlipidemia, unspecified: Secondary | ICD-10-CM

## 2013-07-07 MED ORDER — LEVOTHYROXINE SODIUM 112 MCG PO TABS: 112.0000 ug | ORAL_TABLET | Freq: Every day | ORAL | Status: DC

## 2013-07-07 MED ORDER — ATORVASTATIN CALCIUM 10 MG PO TABS: 10.0000 mg | ORAL_TABLET | Freq: Every day | ORAL | Status: DC

## 2013-07-07 NOTE — Patient Instructions (Signed)
Continue current treatment program  Followup in 1 year sooner if any problem

## 2013-07-07 NOTE — Progress Notes (Signed)
  Subjective:    Patient ID: Angela Houston, female    DOB: [birth year], 62 y.o.   MRN: [id number]  HPI Angela Houston is a 39 -year-old married female nonsmoker G3 P3 who comes in today for general physical examination  She takes Lipitor and aspirin for hyper lipidemia lipids are at goal  She takes thyroid 1 tablet daily 112 mcg TSH levels ago  She gets routine eye care, dental care, BSE monthly, and you mammography, colonoscopy a couple years ago normal. Tetanus booster 2012 seasonal flu shot today. She states she had a bone density October 21. Results not back   Review of Systems  Constitutional: Negative.   HENT: Negative.   Eyes: Negative.   Respiratory: Negative.   Cardiovascular: Negative.   Gastrointestinal: Negative.   Endocrine: Negative.   Genitourinary: Negative.   Musculoskeletal: Negative.   Allergic/Immunologic: Negative.   Neurological: Negative.   Hematological: Negative.   Psychiatric/Behavioral: Negative.        Objective:   Physical Exam  Nursing note and vitals reviewed. Constitutional: She appears well-developed and well-nourished.  HENT:  Head: Normocephalic and atraumatic.  Right Ear: External ear normal.  Left Ear: External ear normal.  Nose: Nose normal.  Mouth/Throat: Oropharynx is clear and moist.  Eyes: EOM are normal. Pupils are equal, round, and reactive to light.  Neck: Normal range of motion. Neck supple. No thyromegaly present.  Cardiovascular: Normal rate, regular rhythm, normal heart sounds and intact distal pulses.  Exam reveals no gallop and no friction rub.   No murmur heard. No carotid aortic bruits peripheral pulses 2+ and symmetrical  Pulmonary/Chest: Effort normal and breath sounds normal.  Abdominal: Soft. Bowel sounds are normal. She exhibits no distension and no mass. There is no tenderness. There is no rebound.  Genitourinary: Vagina normal and uterus normal. Guaiac negative stool. No vaginal discharge found.  Bilateral breast exam  normal  Musculoskeletal: Normal range of motion.  Lymphadenopathy:    She has no cervical adenopathy.  Neurological: She is alert. She has normal reflexes. No cranial nerve deficit. She exhibits normal muscle tone. Coordination normal.  Skin: Skin is warm and dry.  Some sun damage chest and face. She sees a dermatologist on a regular basis and does wear sunscreens  Psychiatric: She has a normal mood and affect. Her behavior is normal. Judgment and thought content normal.          Assessment & Plan:  Healthy female  Hyperlipidemia continue Lipitor and aspirin  Hypothyroidism continue Synthroid

## 2013-11-14 ENCOUNTER — Other Ambulatory Visit: Payer: Self-pay

## 2014-05-14 ENCOUNTER — Other Ambulatory Visit: Payer: Self-pay

## 2014-05-14 DIAGNOSIS — Z1231 Encounter for screening mammogram for malignant neoplasm of breast: Secondary | ICD-10-CM

## 2014-06-19 ENCOUNTER — Ambulatory Visit
Admission: RE | Admit: 2014-06-19 | Discharge: 2014-06-19 | Disposition: A | Discharge status: Home / Self Care | Payer: BC Managed Care – PPO | Source: Ambulatory Visit

## 2014-06-19 DIAGNOSIS — Z1231 Encounter for screening mammogram for malignant neoplasm of breast: Secondary | ICD-10-CM

## 2014-07-01 ENCOUNTER — Other Ambulatory Visit (INDEPENDENT_AMBULATORY_CARE_PROVIDER_SITE_OTHER): Payer: BC Managed Care – PPO

## 2014-07-01 DIAGNOSIS — Z0000 Encounter for general adult medical examination without abnormal findings: Secondary | ICD-10-CM

## 2014-07-01 LAB — CBC WITH DIFFERENTIAL/PLATELET
BASOS ABS: 0.1 10*3/uL (ref 0.0–0.1)
Basophils Relative: 0.9 % (ref 0.0–3.0)
EOS ABS: 0.2 10*3/uL (ref 0.0–0.7)
Eosinophils Relative: 4 % (ref 0.0–5.0)
HCT: 46 % (ref 36.0–46.0)
Hemoglobin: 14.8 g/dL (ref 12.0–15.0)
LYMPHS PCT: 32.1 % (ref 12.0–46.0)
Lymphs Abs: 1.8 10*3/uL (ref 0.7–4.0)
MCHC: 32.2 g/dL (ref 30.0–36.0)
MCV: 92.3 fl (ref 78.0–100.0)
MONO ABS: 0.5 10*3/uL (ref 0.1–1.0)
Monocytes Relative: 8.1 % (ref 3.0–12.0)
NEUTROS PCT: 54.9 % (ref 43.0–77.0)
Neutro Abs: 3 10*3/uL (ref 1.4–7.7)
PLATELETS: 290 10*3/uL (ref 150.0–400.0)
RBC: 4.98 Mil/uL (ref 3.87–5.11)
RDW: 13 % (ref 11.5–15.5)
WBC: 5.5 10*3/uL (ref 4.0–10.5)

## 2014-07-01 LAB — POCT URINALYSIS DIPSTICK
Bilirubin, UA: NEGATIVE
Blood, UA: NEGATIVE
GLUCOSE UA: NEGATIVE
KETONES UA: NEGATIVE
Nitrite, UA: NEGATIVE
Protein, UA: NEGATIVE
SPEC GRAV UA: 1.02
Urobilinogen, UA: 0.2
pH, UA: 7

## 2014-07-01 LAB — HEPATIC FUNCTION PANEL
ALK PHOS: 95 U/L (ref 39–117)
ALT: 29 U/L (ref 0–35)
AST: 41 U/L — ABNORMAL HIGH (ref 0–37)
Albumin: 3.9 g/dL (ref 3.5–5.2)
BILIRUBIN TOTAL: 1 mg/dL (ref 0.2–1.2)
Bilirubin, Direct: 0 mg/dL (ref 0.0–0.3)
Total Protein: 7.4 g/dL (ref 6.0–8.3)

## 2014-07-01 LAB — LIPID PANEL
Cholesterol: 197 mg/dL (ref 0–200)
HDL: 61.4 mg/dL (ref >39.00)
LDL CALC: 102 mg/dL — AB (ref 0–99)
NonHDL: 135.6
Total CHOL/HDL Ratio: 3
Triglycerides: 167 mg/dL — ABNORMAL HIGH (ref 0.0–149.0)
VLDL: 33.4 mg/dL (ref 0.0–40.0)

## 2014-07-01 LAB — BASIC METABOLIC PANEL
BUN: 15 mg/dL (ref 6–23)
CO2: 26 meq/L (ref 19–32)
CREATININE: 0.9 mg/dL (ref 0.4–1.2)
Calcium: 9.9 mg/dL (ref 8.4–10.5)
Chloride: 107 mEq/L (ref 96–112)
GFR: 65.37 mL/min (ref >60.00)
Glucose, Bld: 77 mg/dL (ref 70–99)
Potassium: 4.6 mEq/L (ref 3.5–5.1)
Sodium: 144 mEq/L (ref 135–145)

## 2014-07-01 LAB — TSH: TSH: 0.33 u[IU]/mL — ABNORMAL LOW (ref 0.35–4.50)

## 2014-07-08 ENCOUNTER — Encounter: Payer: Self-pay | Admitting: Family Medicine

## 2014-07-08 ENCOUNTER — Ambulatory Visit (INDEPENDENT_AMBULATORY_CARE_PROVIDER_SITE_OTHER): Payer: BC Managed Care – PPO | Admitting: Family Medicine

## 2014-07-08 VITALS — BP 110/80 | Temp 98.2°F | Ht 66.0 in | Wt 157.0 lb

## 2014-07-08 DIAGNOSIS — I341 Nonrheumatic mitral (valve) prolapse: Secondary | ICD-10-CM

## 2014-07-08 DIAGNOSIS — E038 Other specified hypothyroidism: Secondary | ICD-10-CM

## 2014-07-08 DIAGNOSIS — E785 Hyperlipidemia, unspecified: Secondary | ICD-10-CM

## 2014-07-08 DIAGNOSIS — E033 Postinfectious hypothyroidism: Secondary | ICD-10-CM

## 2014-07-08 DIAGNOSIS — Z23 Encounter for immunization: Secondary | ICD-10-CM

## 2014-07-08 DIAGNOSIS — Z0000 Encounter for general adult medical examination without abnormal findings: Secondary | ICD-10-CM | POA: Insufficient documentation

## 2014-07-08 MED ORDER — LEVOTHYROXINE SODIUM 112 MCG PO TABS: 112.0000 ug | ORAL_TABLET | Freq: Every day | ORAL | Status: DC

## 2014-07-08 MED ORDER — ATORVASTATIN CALCIUM 10 MG PO TABS: 10.0000 mg | ORAL_TABLET | Freq: Every day | ORAL | Status: DC

## 2014-07-08 NOTE — Progress Notes (Signed)
Pre visit review using our clinic review tool, if applicable. No additional management support is needed unless otherwise documented below in the visit note. 

## 2014-07-08 NOTE — Progress Notes (Signed)
   Subjective:    Patient ID: Angela Houston, female    DOB: 11-14-1950, 63 y.o.   MRN: [id number]  HPI Angela Houston is a 63 year old married female nonsmoker G3 P3 LMP around age 33 who comes in today for general physical examination because of a history of hyperlipidemia and hypothyroidism  She takes Lipitor 10 mg daily for hyperlipidemia lipids are at goal  She takes Synthroid 112 g daily because of a history of hypothyroidism.  She gets routine eye care, dental care, BSE monthly, annual mammography, colonoscopy and GI normal,,,,,,,,, her sister Magda Paganini was recently diagnosed at age 62 with breast cancer  Vaccinations updated  Pap smear last year normal and since she is in the low risk category I would recommend Paps every 3 years for Aracelly.  She has a history of mitral valve prolapse asymptomatic. Flu shot was given today   Review of Systems  Constitutional: Negative.   HENT: Negative.   Eyes: Negative.   Respiratory: Negative.   Cardiovascular: Negative.   Gastrointestinal: Negative.   Endocrine: Negative.   Genitourinary: Negative.   Musculoskeletal: Negative.   Skin: Negative.   Allergic/Immunologic: Negative.   Neurological: Negative.   Hematological: Negative.   Psychiatric/Behavioral: Negative.        Objective:   Physical Exam  Nursing note and vitals reviewed. Constitutional: She appears well-developed and well-nourished.  HENT:  Head: Normocephalic and atraumatic.  Right Ear: External ear normal.  Left Ear: External ear normal.  Nose: Nose normal.  Mouth/Throat: Oropharynx is clear and moist.  Eyes: EOM are normal. Pupils are equal, round, and reactive to light.  Neck: Normal range of motion. Neck supple. No JVD present. No tracheal deviation present. No thyromegaly present.  Cardiovascular: Normal rate, regular rhythm, normal heart sounds and intact distal pulses.  Exam reveals no gallop and no friction rub.   No murmur heard. Pulmonary/Chest: Effort normal and  breath sounds normal. No stridor. No respiratory distress. She has no wheezes. She has no rales. She exhibits no tenderness.  Abdominal: Soft. Bowel sounds are normal. She exhibits no distension and no mass. There is no tenderness. There is no rebound and no guarding.  Genitourinary:  Bilateral breast exam normal  Musculoskeletal: Normal range of motion.  Lymphadenopathy:    She has no cervical adenopathy.  Neurological: She is alert. She has normal reflexes. No cranial nerve deficit. She exhibits normal muscle tone. Coordination normal.  Skin: Skin is warm and dry. No rash noted. No erythema. No pallor.  Total body skin exam.... She does have extremely light skin she is meticulous about her sunscreens and sun protection. All her freckles and moles appear benign I don't see anything abnormal. She also has significant hypopigmentation of the back arms and chest.  Psychiatric: She has a normal mood and affect. Her behavior is normal. Judgment and thought content normal.          Assessment & Plan:  Healthy female  Hypothyroidism continue Synthroid  Hyperlipidemia continue Lipitor and aspirin

## 2014-07-08 NOTE — Patient Instructions (Signed)
Continue your current medications  Follow-up in one year sooner if any problems 

## 2014-08-24 ENCOUNTER — Telehealth: Payer: Self-pay | Admitting: Family Medicine

## 2014-08-24 NOTE — Telephone Encounter (Signed)
Please put patient on the schedule for 11:45 08/25/14. Patient is aware.

## 2014-08-24 NOTE — Telephone Encounter (Signed)
Pt said she thinks she has pneumonia and is asking if she can come in tomorrow and see Dr Sherren Mocha

## 2014-08-25 ENCOUNTER — Encounter: Payer: Self-pay | Admitting: Family Medicine

## 2014-08-25 ENCOUNTER — Ambulatory Visit (INDEPENDENT_AMBULATORY_CARE_PROVIDER_SITE_OTHER): Payer: BC Managed Care – PPO | Admitting: Family Medicine

## 2014-08-25 VITALS — BP 110/80 | HR 96 | Temp 98.1°F | Wt 161.0 lb

## 2014-08-25 DIAGNOSIS — J069 Acute upper respiratory infection, unspecified: Secondary | ICD-10-CM | POA: Insufficient documentation

## 2014-08-25 MED ORDER — HYDROCODONE-HOMATROPINE 5-1.5 MG/5ML PO SYRP: 5.0000 mL | ORAL_SOLUTION | Freq: Three times a day (TID) | ORAL | Status: DC | PRN

## 2014-08-25 NOTE — Progress Notes (Signed)
Pre visit review using our clinic review tool, if applicable. No additional management support is needed unless otherwise documented below in the visit note. 

## 2014-08-25 NOTE — Progress Notes (Signed)
   Subjective:    Patient ID: Angela Houston, female    DOB: 07-03-1951, 63 y.o.   MRN: [id number]  HPI Rida is a 63 year old married female nonsmoker who comes in today for evaluation of a cough  Last Wednesday she developed head congestion runny nose and cough. The cough persists. She has no fevers shortness of breath sputum production etc. Her husband also has a cold. 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    Objective:   Physical Exam Well-developed well-nourished female no acute distress HEENT were negative neck was supple no adenopathy lungs are clear       Assessment & Plan:  Viral syndrome plan treat symptomatically antibiotics not indicated

## 2014-08-25 NOTE — Patient Instructions (Signed)
Drink lots of water  Hydromet......Marland Kitchen 1/2-1 teaspoon up to 3 times daily when necessary for cough  Return when necessary

## 2014-09-15 ENCOUNTER — Other Ambulatory Visit: Payer: Self-pay | Admitting: *Deleted

## 2014-09-15 DIAGNOSIS — E038 Other specified hypothyroidism: Secondary | ICD-10-CM

## 2014-09-15 DIAGNOSIS — E785 Hyperlipidemia, unspecified: Secondary | ICD-10-CM

## 2014-09-15 MED ORDER — ATORVASTATIN CALCIUM 10 MG PO TABS: 10.0000 mg | ORAL_TABLET | Freq: Every day | ORAL | Status: DC

## 2014-09-15 MED ORDER — LEVOTHYROXINE SODIUM 112 MCG PO TABS: 112.0000 ug | ORAL_TABLET | Freq: Every day | ORAL | Status: DC

## 2015-02-05 ENCOUNTER — Ambulatory Visit: Payer: Self-pay | Admitting: Family Medicine

## 2015-02-11 ENCOUNTER — Ambulatory Visit (INDEPENDENT_AMBULATORY_CARE_PROVIDER_SITE_OTHER): Payer: 59 | Admitting: Family Medicine

## 2015-02-11 ENCOUNTER — Encounter: Payer: Self-pay | Admitting: Family Medicine

## 2015-02-11 VITALS — BP 122/80 | HR 69 | Temp 98.1°F | Wt 161.0 lb

## 2015-02-11 DIAGNOSIS — K5732 Diverticulitis of large intestine without perforation or abscess without bleeding: Secondary | ICD-10-CM

## 2015-02-11 DIAGNOSIS — W57XXXA Bitten or stung by nonvenomous insect and other nonvenomous arthropods, initial encounter: Secondary | ICD-10-CM

## 2015-02-11 DIAGNOSIS — T148 Other injury of unspecified body region: Secondary | ICD-10-CM | POA: Diagnosis not present

## 2015-02-11 MED ORDER — CIPROFLOXACIN HCL 500 MG PO TABS: 500.0000 mg | ORAL_TABLET | Freq: Two times a day (BID) | ORAL | Status: DC

## 2015-02-11 MED ORDER — METRONIDAZOLE 500 MG PO TABS: 500.0000 mg | ORAL_TABLET | Freq: Three times a day (TID) | ORAL | Status: DC

## 2015-02-11 NOTE — Progress Notes (Signed)
Garret Reddish, MD  Subjective:  Angela Houston is a 64 y.o. year old very pleasant female patient who presents with:  Diverticulitis Tick Bite -Discharged from [facility] on 02/05/15 with diagnosis of diverticulitis. Based off symptoms, no CT scan. Treated with cipro and flagyl for 7 days. Vicodin prn for pain, zofran for nausea. 1 or 2 days left.   Started before she went in with severe diarrhea, 10/10 pain on left side, felt like was going to pass out due to pain, had some blood in stool. Went through Friday so went to ED. No fevers with episode. Colonoscopy 08/09/2005 did not mention diverticulosis. Had a reported episode diverticulosis in 2009, this is now the 3rd episode reportedly.   Appetite is still low, dizzy if she does not eat. Dizziness one of most bothersome symptoms. Able to push fluids. States she felt similar to this 4 years ago when she was bit by a tick and had "tick fever". Had a tick bite 2nd week of May. Review of chart shows she had fatigue, headache, malaise, chills but no abdominal pain, diarrhea at that time. Abdominal pain has largely resolved except for twinge of pain at times or if area pressed on. No longer passing any blood. Still with some loose stools-reports 5x a day but minimal solid food. Review of prior labs show she had igg positive and igm negative for RMSF and negative lyme testing igm. She requests repeat testing.   ROS- no headache, no fever. No chills. Mild nausea, no vomiting. No rash on hands, no target lesion.   Past Medical History- MVP, HLD, hypothyroidism  Medications- reviewed and updated Current Outpatient Prescriptions  Medication Sig Dispense Refill  . aspirin 81 MG tablet Take 81 mg by mouth daily.      Marland Kitchen atorvastatin (LIPITOR) 10 MG tablet Take 1 tablet (10 mg total) by mouth daily. 90 tablet 3  . b complex vitamins tablet Take 1 tablet by mouth daily.    . Cholecalciferol (VITAMIN D) 2000 UNITS CAPS Take 2,000 Units by mouth  daily.      Marland Kitchen co-enzyme Q-10 50 MG capsule Take 50 mg by mouth daily.    Marland Kitchen levothyroxine (SYNTHROID, LEVOTHROID) 112 MCG tablet Take 1 tablet (112 mcg total) by mouth daily. 90 tablet 3   No current facility-administered medications for this visit.    Objective: BP 122/80 mmHg  Pulse 69  Temp(Src) 98.1 F (36.7 C)  Wt 161 lb (73.029 kg) Gen: NAD, resting comfortably in chair, moves easily to table CV: RRR no murmurs rubs or gallops Lungs: CTAB no crackles, wheeze, rhonchi Abdomen: soft/nondistended/normal bowel sounds. No rebound or guarding. Pain with palpation of LLQ in almost pinpoint area Ext: no edema Skin: warm, dry, no rash Neuro: grossly normal, moves all extremities Neuro: CN II-XII intact, sensation and reflexes normal throughout, 5/5 muscle strength in bilateral upper and lower extremities. Normal finger to nose. Normal rapid alternating movements. Normal gait.   Assessment/Plan:  Diverticulitis Tick Bite Given continued pain and original course only 7 days, will extend antibiotics another 7 days. I think the dizziness is due to continued diarrhea. Glad bleeding has resolved. Never officially had diverticulosis on colonoscopy so refer to GI for other potential etiologies and repeat colonoscopy-last 2006, requests [facility] referral. Follow up 1 week.   Current symptoms far different from RMSF treatment years ago but patient very concerned and requests lab testing. I ordered antibodies as below but think likelihood is so low for tick born disease (  no fever, no headache, no rash) and so high for diverticulitis (LLQ pain, diarrhea, blood in stool), decision made to treat for diverticulitis alone. Does appear had prior RMSF exposure so igg should still be positive but not IGM. Strict  Return precautions advised.   Orders Placed This Encounter  Procedures  . Rocky mtn spotted fvr abs pnl(IgG+IgM)  . B. Burgdorfi Antibodies  . Ambulatory referral to Gastroenterology     Referral Priority:  Routine    Referral Type:  Consultation    Referral Reason:  Specialty Services Required    Requested Specialty:  Gastroenterology    Number of Visits Requested:  1    Meds ordered this encounter  Medications  . ciprofloxacin (CIPRO) 500 MG tablet    Sig: Take 1 tablet (500 mg total) by mouth 2 (two) times daily.    Dispense:  14 tablet    Refill:  0  . metroNIDAZOLE (FLAGYL) 500 MG tablet    Sig: Take 1 tablet (500 mg total) by mouth 3 (three) times daily.    Dispense:  21 tablet    Refill:  0

## 2015-02-11 NOTE — Patient Instructions (Signed)
Sign release of information at the front desk for ED report and labs from ED visit at [facility]  See me in 1 week  Extend antibiotics another week, keep pushing fluids, ok to take it easy on food  Dizziness likely related to fluid losses, normal neurological exam  Per your request, check for lyme disease and Rocky mountain spotted fever but I highly doubt these diagnosis, we will hold off on treatment unless you develop fever or rash.   Refer to GI- colonoscopy should wait likely 6 weeks after recovery

## 2015-02-12 LAB — B. BURGDORFI ANTIBODIES: B BURGDORFERI AB IGG+ IGM: 0.47 {ISR}

## 2015-02-15 ENCOUNTER — Ambulatory Visit: Payer: Self-pay | Admitting: Internal Medicine

## 2015-02-15 LAB — ROCKY MTN SPOTTED FVR ABS PNL(IGG+IGM)
RMSF IGG: 3.32 IV — AB
RMSF IgM: 0.43 IV

## 2015-02-18 ENCOUNTER — Ambulatory Visit: Payer: 59 | Admitting: Family Medicine

## 2015-03-18 LAB — HM COLONOSCOPY

## 2015-04-08 ENCOUNTER — Encounter: Payer: Self-pay | Admitting: Family Medicine

## 2015-07-16 ENCOUNTER — Encounter: Payer: 59 | Admitting: Gastroenterology

## 2015-07-19 ENCOUNTER — Other Ambulatory Visit: Payer: Self-pay

## 2015-07-19 DIAGNOSIS — Z1231 Encounter for screening mammogram for malignant neoplasm of breast: Secondary | ICD-10-CM

## 2015-08-19 ENCOUNTER — Ambulatory Visit: Payer: 59

## 2015-09-07 ENCOUNTER — Ambulatory Visit
Admission: RE | Admit: 2015-09-07 | Discharge: 2015-09-07 | Disposition: A | Discharge status: Home / Self Care | Payer: 59 | Source: Ambulatory Visit

## 2015-09-07 DIAGNOSIS — Z1231 Encounter for screening mammogram for malignant neoplasm of breast: Secondary | ICD-10-CM

## 2015-09-21 ENCOUNTER — Other Ambulatory Visit: Payer: Self-pay

## 2015-09-22 ENCOUNTER — Other Ambulatory Visit (INDEPENDENT_AMBULATORY_CARE_PROVIDER_SITE_OTHER): Payer: Medicare Other

## 2015-09-22 DIAGNOSIS — E039 Hypothyroidism, unspecified: Secondary | ICD-10-CM

## 2015-09-22 DIAGNOSIS — E785 Hyperlipidemia, unspecified: Secondary | ICD-10-CM

## 2015-09-22 DIAGNOSIS — Z0000 Encounter for general adult medical examination without abnormal findings: Secondary | ICD-10-CM

## 2015-09-22 LAB — POCT URINALYSIS DIPSTICK
BILIRUBIN UA: NEGATIVE
Blood, UA: NEGATIVE
Glucose, UA: NEGATIVE
Ketones, UA: NEGATIVE
NITRITE UA: NEGATIVE
Protein, UA: NEGATIVE
Spec Grav, UA: 1.015
Urobilinogen, UA: 0.2
pH, UA: 7.5

## 2015-09-22 LAB — HEPATIC FUNCTION PANEL
ALT: 23 U/L (ref 0–35)
AST: 27 U/L (ref 0–37)
Albumin: 4.4 g/dL (ref 3.5–5.2)
Alkaline Phosphatase: 107 U/L (ref 39–117)
BILIRUBIN TOTAL: 0.6 mg/dL (ref 0.2–1.2)
Bilirubin, Direct: 0.1 mg/dL (ref 0.0–0.3)
TOTAL PROTEIN: 6.7 g/dL (ref 6.0–8.3)

## 2015-09-22 LAB — CBC WITH DIFFERENTIAL/PLATELET
Basophils Absolute: 0 10*3/uL (ref 0.0–0.1)
Basophils Relative: 0.6 % (ref 0.0–3.0)
EOS PCT: 3.5 % (ref 0.0–5.0)
Eosinophils Absolute: 0.2 10*3/uL (ref 0.0–0.7)
HEMATOCRIT: 47 % — AB (ref 36.0–46.0)
Hemoglobin: 15.4 g/dL — ABNORMAL HIGH (ref 12.0–15.0)
Lymphocytes Relative: 30.9 % (ref 12.0–46.0)
Lymphs Abs: 2.1 10*3/uL (ref 0.7–4.0)
MCHC: 32.8 g/dL (ref 30.0–36.0)
MCV: 91.9 fl (ref 78.0–100.0)
Monocytes Absolute: 0.5 10*3/uL (ref 0.1–1.0)
Monocytes Relative: 7.4 % (ref 3.0–12.0)
NEUTROS ABS: 3.9 10*3/uL (ref 1.4–7.7)
Neutrophils Relative %: 57.6 % (ref 43.0–77.0)
PLATELETS: 330 10*3/uL (ref 150.0–400.0)
RBC: 5.11 Mil/uL (ref 3.87–5.11)
RDW: 13.6 % (ref 11.5–15.5)
WBC: 6.7 10*3/uL (ref 4.0–10.5)

## 2015-09-22 LAB — BASIC METABOLIC PANEL
BUN: 18 mg/dL (ref 6–23)
CO2: 32 mEq/L (ref 19–32)
Calcium: 10 mg/dL (ref 8.4–10.5)
Chloride: 104 mEq/L (ref 96–112)
Creatinine, Ser: 0.84 mg/dL (ref 0.40–1.20)
GFR: 72.32 mL/min (ref >60.00)
Glucose, Bld: 94 mg/dL (ref 70–99)
POTASSIUM: 5.1 meq/L (ref 3.5–5.1)
Sodium: 142 mEq/L (ref 135–145)

## 2015-09-22 LAB — LIPID PANEL
CHOLESTEROL: 230 mg/dL — AB (ref 0–200)
HDL: 58.1 mg/dL (ref >39.00)
NonHDL: 171.93
TRIGLYCERIDES: 228 mg/dL — AB (ref 0.0–149.0)
Total CHOL/HDL Ratio: 4
VLDL: 45.6 mg/dL — ABNORMAL HIGH (ref 0.0–40.0)

## 2015-09-22 LAB — TSH: TSH: 2.12 u[IU]/mL (ref 0.35–4.50)

## 2015-09-22 LAB — LDL CHOLESTEROL, DIRECT: LDL DIRECT: 137 mg/dL

## 2015-09-27 ENCOUNTER — Encounter: Payer: Self-pay | Admitting: Family Medicine

## 2015-09-27 ENCOUNTER — Ambulatory Visit (INDEPENDENT_AMBULATORY_CARE_PROVIDER_SITE_OTHER): Payer: Medicare Other | Admitting: Family Medicine

## 2015-09-27 VITALS — BP 120/80 | Temp 98.4°F | Ht 66.0 in | Wt 167.0 lb

## 2015-09-27 DIAGNOSIS — Z0000 Encounter for general adult medical examination without abnormal findings: Secondary | ICD-10-CM

## 2015-09-27 DIAGNOSIS — E785 Hyperlipidemia, unspecified: Secondary | ICD-10-CM

## 2015-09-27 DIAGNOSIS — E038 Other specified hypothyroidism: Secondary | ICD-10-CM

## 2015-09-27 DIAGNOSIS — E033 Postinfectious hypothyroidism: Secondary | ICD-10-CM

## 2015-09-27 DIAGNOSIS — Z23 Encounter for immunization: Secondary | ICD-10-CM | POA: Diagnosis not present

## 2015-09-27 MED ORDER — ATORVASTATIN CALCIUM 10 MG PO TABS: 10.0000 mg | ORAL_TABLET | Freq: Every day | ORAL | Status: DC

## 2015-09-27 MED ORDER — LEVOTHYROXINE SODIUM 112 MCG PO TABS: 112.0000 ug | ORAL_TABLET | Freq: Every day | ORAL | Status: DC

## 2015-09-27 NOTE — Progress Notes (Signed)
Subjective:    Patient ID: Angela Houston, female    DOB: 01/03/1951, 65 y.o.   MRN: [id number]  HPI  Angela Houston is a 65 year old married female nonsmoker who comes in today for general physical examination She takes Lipitor and aspirin because of a history of hyperlipidemia. Lipids are at goalShe takes Synthroid 112 g daily for hypothyroidism. TSH is normal  She gets routine eye care, dental care, BSE monthly, annual mammography, colonoscopy 2016 was normal except for some diverticuli. She had a bout of diverticulitis last summer. She's altered her diet.  LMP when she was around age 65. Last Pap was year ago normal. She is in a monogamous marriage never had any trouble with her Pap smears therefore recommend every 3 years.   Her family history is concerning to her because her sister had breast cancer in 3 of her aunts had breast cancer. Mom and dad neither had any cancers. She would like to see a geneticist and talk about screening genetic studies   she still flu vaccine and Pneumovax and she'll call and get set up for a shingles vaccine when she finds out where she can get it done.    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 3 daughters 2 of whom work it [employer] the other daughter is a Radio producer   Review of Systems  Constitutional: Negative.   HENT: Negative.   Eyes: Negative.   Respiratory: Negative.   Cardiovascular: Negative.   Gastrointestinal: Negative.   Endocrine: Negative.   Genitourinary: Negative.   Musculoskeletal: Negative.   Skin: Negative.   Allergic/Immunologic: Negative.   Neurological: Negative.   Hematological: Negative.   Psychiatric/Behavioral: Negative.        Objective:   Physical Exam  Constitutional: She is oriented to  person, place, and time. She appears well-developed and well-nourished.  HENT:  Head: Normocephalic and atraumatic.  Right Ear: External ear normal.  Left Ear: External ear normal.  Nose: Nose normal.  Mouth/Throat: Oropharynx is clear and moist.  Eyes: EOM are normal. Pupils are equal, round, and reactive to light.  Neck: Normal range of motion. Neck supple. No JVD present. No tracheal deviation present. No thyromegaly present.  Cardiovascular: Normal rate, regular rhythm, normal heart sounds and intact distal pulses.  Exam reveals no gallop and no friction rub.   No murmur heard. Pulmonary/Chest: Effort normal and breath sounds normal. No stridor. No respiratory distress. She has no wheezes. She has no rales. She exhibits no tenderness.  Abdominal: Soft. Bowel sounds are normal. She exhibits no distension and no mass. There is no tenderness. There is no rebound and no guarding.  Genitourinary:   Bilateral breast exam normal except for scar right breast 12:00 half an inch above the nipple from previous biopsy which fortunately benign  Musculoskeletal: Normal range of motion.  Lymphadenopathy:    She has no cervical adenopathy.  Neurological: She is alert and oriented to person, place, and time. She has normal reflexes. No cranial nerve deficit. She exhibits normal muscle tone. Coordination normal.  Skin: Skin is warm and dry. No rash noted. No erythema. No pallor.   Total body skin exam normal  Psychiatric: She has a normal mood and affect. Her behavior is normal. Judgment and  thought content normal.          Assessment & Plan:   healthy female   history of hyperlipidemia,,,,,,,,, lipid panel normal,,,,,,,,, continue current medications   Hypothyroidism,,,,,,,, TSH level normal,,,,,,,,,,,,, continue Synthroid daily

## 2015-09-27 NOTE — Progress Notes (Signed)
Pre visit review using our clinic review tool, if applicable. No additional management support is needed unless otherwise documented below in the visit note. 

## 2015-09-27 NOTE — Patient Instructions (Signed)
Continue current medication  Follow-up in one year sooner if any problems  Tommi Rumps or Almyra Free are 2 new adult nurse practitioners or Dr. Martinique,,,,,,,,,,, call in August for January appointment

## 2016-08-16 ENCOUNTER — Other Ambulatory Visit: Payer: Self-pay | Admitting: Family Medicine

## 2016-08-16 DIAGNOSIS — Z1231 Encounter for screening mammogram for malignant neoplasm of breast: Secondary | ICD-10-CM

## 2016-09-11 HISTORY — PX: COLONOSCOPY: SHX174

## 2016-09-12 DIAGNOSIS — Z0000 Encounter for general adult medical examination without abnormal findings: Secondary | ICD-10-CM | POA: Diagnosis not present

## 2016-09-20 ENCOUNTER — Ambulatory Visit
Admission: RE | Admit: 2016-09-20 | Discharge: 2016-09-20 | Disposition: A | Discharge status: Home / Self Care | Payer: Medicare HMO | Source: Ambulatory Visit | Attending: Family Medicine | Admitting: Family Medicine

## 2016-09-20 DIAGNOSIS — Z1231 Encounter for screening mammogram for malignant neoplasm of breast: Secondary | ICD-10-CM

## 2016-09-28 ENCOUNTER — Ambulatory Visit: Payer: Medicare Other | Admitting: Family Medicine

## 2016-10-02 ENCOUNTER — Ambulatory Visit (INDEPENDENT_AMBULATORY_CARE_PROVIDER_SITE_OTHER): Payer: Medicare HMO | Admitting: Family Medicine

## 2016-10-02 ENCOUNTER — Encounter: Payer: Self-pay | Admitting: Family Medicine

## 2016-10-02 ENCOUNTER — Other Ambulatory Visit (HOSPITAL_COMMUNITY)
Admission: RE | Admit: 2016-10-02 | Discharge: 2016-10-02 | Disposition: A | Discharge status: Home / Self Care | Payer: Medicare HMO | Source: Ambulatory Visit | Attending: Family Medicine | Admitting: Family Medicine

## 2016-10-02 VITALS — BP 128/86 | HR 84 | Temp 97.7°F | Ht 65.0 in | Wt 166.4 lb

## 2016-10-02 DIAGNOSIS — Z23 Encounter for immunization: Secondary | ICD-10-CM | POA: Diagnosis not present

## 2016-10-02 DIAGNOSIS — E033 Postinfectious hypothyroidism: Secondary | ICD-10-CM

## 2016-10-02 DIAGNOSIS — E785 Hyperlipidemia, unspecified: Secondary | ICD-10-CM | POA: Diagnosis not present

## 2016-10-02 DIAGNOSIS — Z0000 Encounter for general adult medical examination without abnormal findings: Secondary | ICD-10-CM

## 2016-10-02 DIAGNOSIS — Z124 Encounter for screening for malignant neoplasm of cervix: Secondary | ICD-10-CM

## 2016-10-02 LAB — BASIC METABOLIC PANEL
BUN: 16 mg/dL (ref 6–23)
CALCIUM: 9.9 mg/dL (ref 8.4–10.5)
CO2: 32 mEq/L (ref 19–32)
CREATININE: 0.87 mg/dL (ref 0.40–1.20)
Chloride: 103 mEq/L (ref 96–112)
GFR: 69.23 mL/min (ref >60.00)
GLUCOSE: 98 mg/dL (ref 70–99)
Potassium: 4.5 mEq/L (ref 3.5–5.1)
SODIUM: 141 meq/L (ref 135–145)

## 2016-10-02 LAB — URINALYSIS, ROUTINE W REFLEX MICROSCOPIC
Bilirubin Urine: NEGATIVE
HGB URINE DIPSTICK: NEGATIVE
Ketones, ur: NEGATIVE
Nitrite: NEGATIVE
RBC / HPF: NONE SEEN (ref 0–?)
SPECIFIC GRAVITY, URINE: 1.02 (ref 1.000–1.030)
Total Protein, Urine: NEGATIVE
URINE GLUCOSE: NEGATIVE
UROBILINOGEN UA: 0.2 (ref 0.0–1.0)
pH: 6.5 (ref 5.0–8.0)

## 2016-10-02 LAB — CBC WITH DIFFERENTIAL/PLATELET
BASOS ABS: 0.1 10*3/uL (ref 0.0–0.1)
Basophils Relative: 0.8 % (ref 0.0–3.0)
EOS ABS: 0.3 10*3/uL (ref 0.0–0.7)
Eosinophils Relative: 3.3 % (ref 0.0–5.0)
HCT: 45.1 % (ref 36.0–46.0)
Hemoglobin: 15.4 g/dL — ABNORMAL HIGH (ref 12.0–15.0)
LYMPHS ABS: 2.4 10*3/uL (ref 0.7–4.0)
Lymphocytes Relative: 29 % (ref 12.0–46.0)
MCHC: 34.3 g/dL (ref 30.0–36.0)
MCV: 90 fl (ref 78.0–100.0)
Monocytes Absolute: 0.6 10*3/uL (ref 0.1–1.0)
Monocytes Relative: 7.2 % (ref 3.0–12.0)
NEUTROS ABS: 5 10*3/uL (ref 1.4–7.7)
NEUTROS PCT: 59.7 % (ref 43.0–77.0)
PLATELETS: 329 10*3/uL (ref 150.0–400.0)
RBC: 5.01 Mil/uL (ref 3.87–5.11)
RDW: 12.8 % (ref 11.5–15.5)
WBC: 8.4 10*3/uL (ref 4.0–10.5)

## 2016-10-02 LAB — LIPID PANEL
Cholesterol: 208 mg/dL — ABNORMAL HIGH (ref 0–200)
HDL: 55 mg/dL (ref >39.00)
NonHDL: 152.55
TRIGLYCERIDES: 214 mg/dL — AB (ref 0.0–149.0)
Total CHOL/HDL Ratio: 4
VLDL: 42.8 mg/dL — ABNORMAL HIGH (ref 0.0–40.0)

## 2016-10-02 LAB — HEPATIC FUNCTION PANEL
ALK PHOS: 107 U/L (ref 39–117)
ALT: 21 U/L (ref 0–35)
AST: 22 U/L (ref 0–37)
Albumin: 4.4 g/dL (ref 3.5–5.2)
BILIRUBIN TOTAL: 0.4 mg/dL (ref 0.2–1.2)
Bilirubin, Direct: 0.1 mg/dL (ref 0.0–0.3)
Total Protein: 7.2 g/dL (ref 6.0–8.3)

## 2016-10-02 LAB — TSH: TSH: 0.73 u[IU]/mL (ref 0.35–4.50)

## 2016-10-02 LAB — LDL CHOLESTEROL, DIRECT: LDL DIRECT: 113 mg/dL

## 2016-10-02 NOTE — Progress Notes (Signed)
Pre visit review using our clinic review tool, if applicable. No additional management support is needed unless otherwise documented below in the visit note. 

## 2016-10-02 NOTE — Patient Instructions (Signed)
It was a pleasure seeing you today! Please go to lab for blood work and follow up in one year or sooner if needed..  We have ordered labs or studies at this visit. It can take up to 1-2 weeks for results and processing. IF results require follow up or explanation, we will call you with instructions. Clinically stable results will be released to your [facility]. If you have not heard from [place] or cannot find your results in [facility] in 2 weeks please contact our office at [phone].  If you are not yet signed up for [facility], please consider signing up   Health Maintenance, Female Introduction Adopting a healthy lifestyle and getting preventive care can go a long way to promote health and wellness. Talk with your health care provider about what schedule of regular examinations is right for you. This is a good chance for you to check in with your provider about disease prevention and staying healthy. In between checkups, there are plenty of things you can do on your own. Experts have done a lot of research about which lifestyle changes and preventive measures are most likely to keep you healthy. Ask your health care provider for more information. Weight and diet Eat a healthy diet  Be sure to include plenty of vegetables, fruits, low-fat dairy products, and lean protein.  Do not eat a lot of foods high in solid fats, added sugars, or salt.  Get regular exercise. This is one of the most important things you can do for your health.  Most adults should exercise for at least 150 minutes each week. The exercise should increase your heart rate and make you sweat (moderate-intensity exercise).  Most adults should also do strengthening exercises at least twice a week. This is in addition to the moderate-intensity exercise. Maintain a healthy weight  Body mass index (BMI) is a measurement that can be used to identify possible weight problems. It estimates body fat based on height and weight. Your health  care provider can help determine your BMI and help you achieve or maintain a healthy weight.  For females 81 years of age and older:  A BMI below 18.5 is considered underweight.  A BMI of 18.5 to 24.9 is normal.  A BMI of 25 to 29.9 is considered overweight.  A BMI of 30 and above is considered obese. Watch levels of cholesterol and blood lipids  You should start having your blood tested for lipids and cholesterol at 66 years of age, then have this test every 5 years.  You may need to have your cholesterol levels checked more often if:  Your lipid or cholesterol levels are high.  You are older than 66 years of age.  You are at high risk for heart disease. Cancer screening Lung Cancer  Lung cancer screening is recommended for adults 1-70 years old who are at high risk for lung cancer because of a history of smoking.  A yearly low-dose CT scan of the lungs is recommended for people who:  Currently smoke.  Have quit within the past 15 years.  Have at least a 30-pack-year history of smoking. A pack year is smoking an average of one pack of cigarettes a day for 1 year.  Yearly screening should continue until it has been 15 years since you quit.  Yearly screening should stop if you develop a health problem that would prevent you from having lung cancer treatment. Breast Cancer  Practice breast self-awareness. This means understanding how your breasts normally  appear and feel.  It also means doing regular breast self-exams. Let your health care provider know about any changes, no matter how small.  If you are in your 20s or 30s, you should have a clinical breast exam (CBE) by a health care provider every 1-3 years as part of a regular health exam.  If you are 101 or older, have a CBE every year. Also consider having a breast X-ray (mammogram) every year.  If you have a family history of breast cancer, talk to your health care provider about genetic screening.  If you are at  high risk for breast cancer, talk to your health care provider about having an MRI and a mammogram every year.  Breast cancer gene (BRCA) assessment is recommended for women who have family members with BRCA-related cancers. BRCA-related cancers include:  Breast.  Ovarian.  Tubal.  Peritoneal cancers.  Results of the assessment will determine the need for genetic counseling and BRCA1 and BRCA2 testing. Cervical Cancer  Your health care provider may recommend that you be screened regularly for cancer of the pelvic organs (ovaries, uterus, and vagina). This screening involves a pelvic examination, including checking for microscopic changes to the surface of your cervix (Pap test). You may be encouraged to have this screening done every 3 years, beginning at age 72.  For women ages 18-65, health care providers may recommend pelvic exams and Pap testing every 3 years, or they may recommend the Pap and pelvic exam, combined with testing for human papilloma virus (HPV), every 5 years. Some types of HPV increase your risk of cervical cancer. Testing for HPV may also be done on women of any age with unclear Pap test results.  Other health care providers may not recommend any screening for nonpregnant women who are considered low risk for pelvic cancer and who do not have symptoms. Ask your health care provider if a screening pelvic exam is right for you.  If you have had past treatment for cervical cancer or a condition that could lead to cancer, you need Pap tests and screening for cancer for at least 20 years after your treatment. If Pap tests have been discontinued, your risk factors (such as having a new sexual partner) need to be reassessed to determine if screening should resume. Some women have medical problems that increase the chance of getting cervical cancer. In these cases, your health care provider may recommend more frequent screening and Pap tests. Colorectal Cancer  This type of cancer  can be detected and often prevented.  Routine colorectal cancer screening usually begins at 66 years of age and continues through 66 years of age.  Your health care provider may recommend screening at an earlier age if you have risk factors for colon cancer.  Your health care provider may also recommend using home test kits to check for hidden blood in the stool.  A small camera at the end of a tube can be used to examine your colon directly (sigmoidoscopy or colonoscopy). This is done to check for the earliest forms of colorectal cancer.  Routine screening usually begins at age 60.  Direct examination of the colon should be repeated every 5-10 years through 66 years of age. However, you may need to be screened more often if early forms of precancerous polyps or small growths are found. Skin Cancer  Check your skin from head to toe regularly.  Tell your health care provider about any new moles or changes in moles, especially if there  is a change in a mole's shape or color.  Also tell your health care provider if you have a mole that is larger than the size of a pencil eraser.  Always use sunscreen. Apply sunscreen liberally and repeatedly throughout the day.  Protect yourself by wearing long sleeves, pants, a wide-brimmed hat, and sunglasses whenever you are outside. Heart disease, diabetes, and high blood pressure  High blood pressure causes heart disease and increases the risk of stroke. High blood pressure is more likely to develop in:  People who have blood pressure in the high end of the normal range (130-139/85-89 mm Hg).  People who are overweight or obese.  People who are African American.  If you are 19-64 years of age, have your blood pressure checked every 3-5 years. If you are 97 years of age or older, have your blood pressure checked every year. You should have your blood pressure measured twice-once when you are at a hospital or clinic, and once when you are not at a  hospital or clinic. Record the average of the two measurements. To check your blood pressure when you are not at a hospital or clinic, you can use:  An automated blood pressure machine at a pharmacy.  A home blood pressure monitor.  If you are between 56 years and 68 years old, ask your health care provider if you should take aspirin to prevent strokes.  Have regular diabetes screenings. This involves taking a blood sample to check your fasting blood sugar level.  If you are at a normal weight and have a low risk for diabetes, have this test once every three years after 66 years of age.  If you are overweight and have a high risk for diabetes, consider being tested at a younger age or more often. Preventing infection Hepatitis B  If you have a higher risk for hepatitis B, you should be screened for this virus. You are considered at high risk for hepatitis B if:  You were born in a country where hepatitis B is common. Ask your health care provider which countries are considered high risk.  Your parents were born in a high-risk country, and you have not been immunized against hepatitis B (hepatitis B vaccine).  You have HIV or AIDS.  You use needles to inject street drugs.  You live with someone who has hepatitis B.  You have had sex with someone who has hepatitis B.  You get hemodialysis treatment.  You take certain medicines for conditions, including cancer, organ transplantation, and autoimmune conditions. Hepatitis C  Blood testing is recommended for:  Everyone born from [birth year] through 1965.  Anyone with known risk factors for hepatitis C. Sexually transmitted infections (STIs)  You should be screened for sexually transmitted infections (STIs) including gonorrhea and chlamydia if:  You are sexually active and are younger than 65 years of age.  You are older than 66 years of age and your health care provider tells you that you are at risk for this type of  infection.  Your sexual activity has changed since you were last screened and you are at an increased risk for chlamydia or gonorrhea. Ask your health care provider if you are at risk.  If you do not have HIV, but are at risk, it may be recommended that you take a prescription medicine daily to prevent HIV infection. This is called pre-exposure prophylaxis (PrEP). You are considered at risk if:  You are sexually active and do not regularly use  condoms or know the HIV status of your partner(s).  You take drugs by injection.  You are sexually active with a partner who has HIV. Talk with your health care provider about whether you are at high risk of being infected with HIV. If you choose to begin PrEP, you should first be tested for HIV. You should then be tested every 3 months for as long as you are taking PrEP. Pregnancy  If you are premenopausal and you may become pregnant, ask your health care provider about preconception counseling.  If you may become pregnant, take 400 to 800 micrograms (mcg) of folic acid every day.  If you want to prevent pregnancy, talk to your health care provider about birth control (contraception). Osteoporosis and menopause  Osteoporosis is a disease in which the bones lose minerals and strength with aging. This can result in serious bone fractures. Your risk for osteoporosis can be identified using a bone density scan.  If you are 10 years of age or older, or if you are at risk for osteoporosis and fractures, ask your health care provider if you should be screened.  Ask your health care provider whether you should take a calcium or vitamin D supplement to lower your risk for osteoporosis.  Menopause may have certain physical symptoms and risks.  Hormone replacement therapy may reduce some of these symptoms and risks. Talk to your health care provider about whether hormone replacement therapy is right for you. Follow these instructions at home:  Schedule  regular health, dental, and eye exams.  Stay current with your immunizations.  Do not use any tobacco products including cigarettes, chewing tobacco, or electronic cigarettes.  If you are pregnant, do not drink alcohol.  If you are breastfeeding, limit how much and how often you drink alcohol.  Limit alcohol intake to no more than 1 drink per day for nonpregnant women. One drink equals 12 ounces of beer, 5 ounces of wine, or 1 ounces of hard liquor.  Do not use street drugs.  Do not share needles.  Ask your health care provider for help if you need support or information about quitting drugs.  Tell your health care provider if you often feel depressed.  Tell your health care provider if you have ever been abused or do not feel safe at home. This information is not intended to replace advice given to you by your health care provider. Make sure you discuss any questions you have with your health care provider. Document Released: 03/13/2011 Document Revised: 02/03/2016 Document Reviewed: 06/01/2015  2017 [employer]

## 2016-10-02 NOTE — Progress Notes (Signed)
Subjective:    Patient ID: Angela Houston, female    DOB: 11/30/1950, 66 y.o.   MRN: [id number]  HPI  Ms. Swafford is a 66 year old female who presents today for a routine physical.  She is a nonsmoker   LMP around age 29.  Last Pap was in 2014 and was normal. She denies previous problems with her Pap smears. Her previous PCP recommended paps every 3 years and she is interested in having one today. Recent mammogram was negative for malignancy.   She is due for pneumovax and will also consider shingles vaccine. She will wait for ShingRx and said that she would speak with her son in law who is a Software engineer.   Follows a modified heart healthy diet. Yoga at least 30 minutes/day and she completes yard work. She also watches her grandchildren. She exercises without any cardiopulmonary symptoms.  She reports having a healthcare POA and living well.  No safety issues identified in the home per patient report such as no guns in the house.  Review of Systems Constitutional: No fever, chills, significant weight change, fatigue, weakness or night sweats Eyes: No redness, discharge, pain, blurred vision, double vision, or loss of vision ENT/mouth: No nasal congestion, postnasal drainage,epistaxis, purulent discharge, earache, hearing loss, tinnitus ,sore throat , dental pain, or hoarseness   Cardiovascular: no chest pain, palpitations, racing, irregular rhythm, syncope, nausea, sweating, claudication, or edema  Respiratory: No cough, sputum production,hemoptysis,  dyspnea, paroxysmal nocturnal dyspnea, pleuritic chest pain, significant snoring, or  apnea    Gastrointestinal: No heartburn,dysphagia, nausea and vomiting,ominal pain, change in bowels, anorexia, diarrhea, significant constipation, rectal bleeding, melena,  stool incontinence or jaundice Genitourinary: No dysuria,hematuria, pyuria, frequency, urgency,  incontinence, nocturia, dark urine or flank pain Musculoskeletal: No myalgias or muscle  cramping, joint stiffness, joint swelling, joint color change, weakness, or cyanosis Dermatologic: No rash, pruritus, urticaria, or change in color or temperature of skin Neurologic: No headache, vertigo, limb weakness, tremor, gait disturbance, seizures, memory loss, numbness or tingling Psychiatric: No significant anxiety or depression, anhedonia, panic attacks, insomnia, or anorexia Endocrine: No change in hair/skin/ nails, excessive thirst, excessive hunger, excessive urination, or unexplained fatigue Hematologic/lymphatic: No bruising, lymphadenopathy,or  abnormal clotting Allergy/immunology: No itchy/ watery eyes, abnormal sneezing, rhinitis, urticaria ,or angioedema  Past Medical History:  Diagnosis Date  . Diverticulitis   . Hyperlipidemia   . Hypothyroidism   . Kidney stones   . Mitral valve prolapse   . Ovarian cyst   . Thyroid disease      Social History   Social History  . Marital status: Married    Spouse name: N/A  . Number of children: 3  . Years of education: N/A   Occupational History  . housewife    Social History Main Topics  . Smoking status: Never Smoker  . Smokeless tobacco: Never Used  . Alcohol use No  . Drug use: No  . Sexual activity: Not on file   Other Topics Concern  . Not on file   Social History Narrative  . No narrative on file    Past Surgical History:  Procedure Laterality Date  . APPENDECTOMY    . OVARIAN CYST REMOVAL    . TONSILLECTOMY    . TUBAL LIGATION      Family History  Problem Relation Age of Onset  . COPD Father     smoker  . Hyperlipidemia Father   . Glaucoma Father   . Heart disease Father   .  Breast cancer Sister   . Colon cancer Neg Hx     Allergies  Allergen Reactions  . Sulfamethoxazole-Trimethoprim     REACTION: fever,headache,chills,rash    Current Outpatient Prescriptions on File Prior to Visit  Medication Sig Dispense Refill  . aspirin 81 MG tablet Take 81 mg by mouth daily.      Marland Kitchen  atorvastatin (LIPITOR) 10 MG tablet Take 1 tablet (10 mg total) by mouth daily. 90 tablet 3  . b complex vitamins tablet Take 1 tablet by mouth daily.    . Cholecalciferol (VITAMIN D) 2000 UNITS CAPS Take 2,000 Units by mouth daily.      Marland Kitchen co-enzyme Q-10 50 MG capsule Take 50 mg by mouth daily.    Marland Kitchen levothyroxine (SYNTHROID, LEVOTHROID) 112 MCG tablet Take 1 tablet (112 mcg total) by mouth daily. 90 tablet 3   No current facility-administered medications on file prior to visit.     BP 128/86   Temp 97.7 F (36.5 C) (Oral)   Ht 5\' 5"  (1.651 m)   Wt 166 lb 6.4 oz (75.5 kg)   BMI 27.69 kg/m       Objective:   Physical Exam  Physical Exam  Constitutional: She is oriented to person, place, and time. She appears well-developed and well-nourished. No distress.  HENT:  Head: Normocephalic and atraumatic.  Right Ear: Tympanic membrane and ear canal normal.  Left Ear: Tympanic membrane and ear canal normal.  Mouth/Throat: Oropharynx is clear and moist.  Eyes: Pupils are equal, round, and reactive to light. No scleral icterus.  Neck: Normal range of motion. No thyromegaly present.  Cardiovascular: Normal rate and regular rhythm.   No murmur heard. Pulmonary/Chest: Effort normal and breath sounds normal. No respiratory distress. He has no wheezes. She has no rales. She exhibits no tenderness.  Abdominal: Soft. Bowel sounds are normal. She exhibits no distension and no mass. There is no tenderness. There is no rebound and no guarding.  Musculoskeletal: She exhibits no edema.  Lymphadenopathy:    She has no cervical adenopathy.  Neurological: She is alert and oriented to person, place, and time. She has normal patellar reflexes. She exhibits normal muscle tone. Coordination normal.  Skin: Skin is warm and dry.  Psychiatric: She has a normal mood and affect. Her behavior is normal. Judgment and thought content normal.  Breasts: Examined lying Right: Without masses, retractions, discharge  or axillary adenopathy.  Left: Without masses, retractions, discharge or axillary adenopathy.  Inguinal/mons: Normal without inguinal adenopathy  External genitalia: Normal  BUS/Urethra/Skene's glands: Normal  Bladder: Normal  Vagina: Normal  Cervix: Normal  Uterus: normal in size, shape and contour. Midline and mobile  Adnexa/parametria:  Rt: Without masses or tenderness.  Lt: Without masses or tenderness.  Anus and perineum: Normal      Assessment & Plan:  1. Routine general medical examination at a health care facility 66 y.o. female presenting for annual physical.  Health Maintenance counseling: 1. Anticipatory guidance: Patient counseled regarding regular dental exams, eye exams, wearing seatbelts.  2. Risk factor reduction:  Advised patient of need for regular exercise and diet rich and fruits and vegetables to reduce risk of heart attack and stroke.  3. Immunizations/screenings/ancillary studies Immunization History  Administered Date(s) Administered  . Influenza Split 06/04/2012  . Influenza Whole 07/26/2009  . Influenza, High Dose Seasonal PF 09/27/2015, 10/02/2016  . Influenza,inj,Quad PF,36+ Mos 07/07/2013, 07/08/2014  . Pneumococcal Conjugate-13 09/27/2015  . Tdap 12/05/2010   Health Maintenance Due  Topic Date  Due  . Hepatitis C Screening  Sep 23, 1950  . ZOSTAVAX  09/19/2010  . INFLUENZA VACCINE  04/11/2016  . PNA vac Low Risk Adult (2 of 2 - PPSV23) 09/26/2016   4. Cervical cancer screening- Pap today; Advised her of guidelines that Pap will be discontinued after 65 with history of 3 prior normal screenings and one within the last 5 years. Pap normal in 2014 and Pap sent today. 5. Breast cancer screening-  breast exam today: Normal; see findings above, stressed importance of monthly screening. and mammogram 09/20/2016 negative for malignancy, screening recommended in one year 6. Colon cancer screening - She is UTD with last screening 2016. History of  diverticulosis 7. Skin cancer screening- No suspicious moles/lesions noted. Advised use of daily sunscreen and monitoring changes in macules/moles.  Discussed ABCDEs of screening with patient.  2. Hyperlipidemia, unspecified hyperlipidemia type Continue Lipitor as prescribed. Will obtain lipid level today - CBC with Differential/Platelet - Lipid panel - Hepatic function panel - Basic metabolic panel - Urinalysis, Routine w reflex microscopic  3. Postinfectious hypothyroidism Continue levothyroxine as ordered; TSH will be obtained today. - TSH  4. Encounter for immunization  - Flu vaccine HIGH DOSE PF  5. Pap smear for cervical cancer screening  - Cytology - PAP  Follow up in one year or sooner if needed. Lab results will be called to patient and any recommendations will be completed at that time.  Delano Metz, FNP-C

## 2016-10-03 LAB — CYTOLOGY - PAP: DIAGNOSIS: NEGATIVE

## 2016-10-23 ENCOUNTER — Other Ambulatory Visit: Payer: Self-pay | Admitting: Emergency Medicine

## 2016-10-23 DIAGNOSIS — E038 Other specified hypothyroidism: Secondary | ICD-10-CM

## 2016-10-23 DIAGNOSIS — E785 Hyperlipidemia, unspecified: Secondary | ICD-10-CM

## 2016-10-23 MED ORDER — LEVOTHYROXINE SODIUM 112 MCG PO TABS: 112.0000 ug | ORAL_TABLET | Freq: Every day | ORAL | 3 refills | Status: DC

## 2016-10-23 MED ORDER — ATORVASTATIN CALCIUM 10 MG PO TABS: 10.0000 mg | ORAL_TABLET | Freq: Every day | ORAL | 3 refills | Status: DC

## 2016-12-04 DIAGNOSIS — R69 Illness, unspecified: Secondary | ICD-10-CM | POA: Diagnosis not present

## 2016-12-28 DIAGNOSIS — D1801 Hemangioma of skin and subcutaneous tissue: Secondary | ICD-10-CM | POA: Diagnosis not present

## 2016-12-28 DIAGNOSIS — D225 Melanocytic nevi of trunk: Secondary | ICD-10-CM | POA: Diagnosis not present

## 2016-12-28 DIAGNOSIS — L570 Actinic keratosis: Secondary | ICD-10-CM | POA: Diagnosis not present

## 2016-12-28 DIAGNOSIS — Z85828 Personal history of other malignant neoplasm of skin: Secondary | ICD-10-CM | POA: Diagnosis not present

## 2016-12-28 DIAGNOSIS — L814 Other melanin hyperpigmentation: Secondary | ICD-10-CM | POA: Diagnosis not present

## 2016-12-28 DIAGNOSIS — L821 Other seborrheic keratosis: Secondary | ICD-10-CM | POA: Diagnosis not present

## 2016-12-28 DIAGNOSIS — D485 Neoplasm of uncertain behavior of skin: Secondary | ICD-10-CM | POA: Diagnosis not present

## 2016-12-28 DIAGNOSIS — C44629 Squamous cell carcinoma of skin of left upper limb, including shoulder: Secondary | ICD-10-CM | POA: Diagnosis not present

## 2016-12-28 DIAGNOSIS — L80 Vitiligo: Secondary | ICD-10-CM | POA: Diagnosis not present

## 2017-02-15 DIAGNOSIS — C44629 Squamous cell carcinoma of skin of left upper limb, including shoulder: Secondary | ICD-10-CM | POA: Diagnosis not present

## 2017-02-15 DIAGNOSIS — Z85828 Personal history of other malignant neoplasm of skin: Secondary | ICD-10-CM | POA: Diagnosis not present

## 2017-02-21 ENCOUNTER — Encounter: Payer: Self-pay | Admitting: Family Medicine

## 2017-02-23 ENCOUNTER — Ambulatory Visit (INDEPENDENT_AMBULATORY_CARE_PROVIDER_SITE_OTHER): Payer: Medicare HMO | Admitting: Adult Health

## 2017-02-23 VITALS — BP 110/70 | Temp 98.1°F | Ht 65.0 in | Wt 170.7 lb

## 2017-02-23 DIAGNOSIS — J40 Bronchitis, not specified as acute or chronic: Secondary | ICD-10-CM | POA: Diagnosis not present

## 2017-02-23 MED ORDER — PREDNISONE 10 MG PO TABS: ORAL_TABLET | ORAL | 0 refills | Status: DC

## 2017-02-23 NOTE — Progress Notes (Signed)
Subjective:    Patient ID: Angela Houston, female    DOB: 06/14/1951, 65 y.o.   MRN: [id number]  Cough  This is a new problem. The current episode started in the past 7 days. The problem has been unchanged. The cough is non-productive. Associated symptoms include shortness of breath and wheezing. Pertinent negatives include no chest pain, chills, ear congestion, ear pain, fever, headaches, nasal congestion, rhinorrhea or sore throat. Exacerbated by: deep breath  She has tried nothing for the symptoms. Her past medical history is significant for pneumonia. There is no history of asthma, bronchitis or environmental allergies.      Review of Systems  Constitutional: Negative for chills and fever.  HENT: Negative for ear pain, rhinorrhea, sinus pain, sinus pressure and sore throat.   Respiratory: Positive for cough, shortness of breath and wheezing.   Cardiovascular: Negative for chest pain.  Gastrointestinal: Negative.   Allergic/Immunologic: Negative for environmental allergies.  Neurological: Negative for headaches.   Past Medical History:  Diagnosis Date  . Diverticulitis   . Hyperlipidemia   . Hypothyroidism   . Kidney stones   . Mitral valve prolapse   . Ovarian cyst   . Thyroid disease     Social History   Social History  . Marital status: Married    Spouse name: N/A  . Number of children: 3  . Years of education: N/A   Occupational History  . housewife    Social History Main Topics  . Smoking status: Never Smoker  . Smokeless tobacco: Never Used  . Alcohol use No  . Drug use: No  . Sexual activity: Not on file   Other Topics Concern  . Not on file   Social History Narrative  . No narrative on file    Past Surgical History:  Procedure Laterality Date  . APPENDECTOMY    . OVARIAN CYST REMOVAL    . TONSILLECTOMY    . TUBAL LIGATION      Family History  Problem Relation Age of Onset  . COPD Father        smoker  . Hyperlipidemia Father   . Glaucoma  Father   . Heart disease Father   . Breast cancer Sister   . Colon cancer Neg Hx     Allergies  Allergen Reactions  . Sulfamethoxazole-Trimethoprim     REACTION: fever,headache,chills,rash    Current Outpatient Prescriptions on File Prior to Visit  Medication Sig Dispense Refill  . aspirin 81 MG tablet Take 81 mg by mouth daily.      Marland Kitchen atorvastatin (LIPITOR) 10 MG tablet Take 1 tablet (10 mg total) by mouth daily. 90 tablet 3  . b complex vitamins tablet Take 1 tablet by mouth daily.    . Cholecalciferol (VITAMIN D) 2000 UNITS CAPS Take 2,000 Units by mouth daily.      Marland Kitchen co-enzyme Q-10 50 MG capsule Take 50 mg by mouth daily.    Marland Kitchen levothyroxine (SYNTHROID, LEVOTHROID) 112 MCG tablet Take 1 tablet (112 mcg total) by mouth daily. 90 tablet 3   No current facility-administered medications on file prior to visit.     BP 110/70 (BP Location: Left Arm, Patient Position: Sitting, Cuff Size: Normal)   Temp 98.1 F (36.7 C) (Oral)   Ht 5\' 5"  (1.651 m)   Wt 170 lb 11.2 oz (77.4 kg)   BMI 28.41 kg/m       Objective:   Physical Exam  Constitutional: She is oriented to person, place, and  time. She appears well-developed and well-nourished. No distress.  Cardiovascular: Normal rate, regular rhythm, normal heart sounds and intact distal pulses.  Exam reveals no gallop and no friction rub.   No murmur heard. Pulmonary/Chest: Effort normal. No respiratory distress. She has wheezes in the right upper field, the right middle field, the left upper field and the left middle field. She has no rhonchi. She has no rales. She exhibits no tenderness.  Neurological: She is alert and oriented to person, place, and time.  Skin: Skin is warm and dry. No rash noted. She is not diaphoretic. No erythema. No pallor.  Psychiatric: She has a normal mood and affect. Her behavior is normal. Judgment and thought content normal.  Nursing note and vitals reviewed.      Assessment & Plan:  1. Bronchitis - No  signs of infection. Will treat as bronchitis  - predniSONE (DELTASONE) 10 MG tablet; 40 mg x 3 days, 20 mg x 3 days, 10 mg x 3 days  Dispense: 21 tablet; Refill: 0 - Follow up if no improvement in the next 2-3 days or sooner if fever develops   Angela Peng, Angela Houston

## 2017-04-03 DIAGNOSIS — M7981 Nontraumatic hematoma of soft tissue: Secondary | ICD-10-CM | POA: Diagnosis not present

## 2017-04-03 DIAGNOSIS — Z85828 Personal history of other malignant neoplasm of skin: Secondary | ICD-10-CM | POA: Diagnosis not present

## 2017-06-18 DIAGNOSIS — R69 Illness, unspecified: Secondary | ICD-10-CM | POA: Diagnosis not present

## 2017-08-08 DIAGNOSIS — H2513 Age-related nuclear cataract, bilateral: Secondary | ICD-10-CM | POA: Diagnosis not present

## 2017-08-08 DIAGNOSIS — H5203 Hypermetropia, bilateral: Secondary | ICD-10-CM | POA: Diagnosis not present

## 2017-08-08 DIAGNOSIS — H43813 Vitreous degeneration, bilateral: Secondary | ICD-10-CM | POA: Diagnosis not present

## 2017-08-08 DIAGNOSIS — H524 Presbyopia: Secondary | ICD-10-CM | POA: Diagnosis not present

## 2017-09-17 ENCOUNTER — Other Ambulatory Visit: Payer: Self-pay | Admitting: Adult Health

## 2017-09-17 DIAGNOSIS — Z1231 Encounter for screening mammogram for malignant neoplasm of breast: Secondary | ICD-10-CM

## 2017-10-04 ENCOUNTER — Encounter: Payer: Self-pay | Admitting: Adult Health

## 2017-10-04 ENCOUNTER — Ambulatory Visit (INDEPENDENT_AMBULATORY_CARE_PROVIDER_SITE_OTHER): Payer: Medicare HMO | Admitting: Adult Health

## 2017-10-04 VITALS — BP 120/76 | Temp 98.0°F | Ht 66.0 in | Wt 170.0 lb

## 2017-10-04 DIAGNOSIS — E785 Hyperlipidemia, unspecified: Secondary | ICD-10-CM

## 2017-10-04 DIAGNOSIS — Z23 Encounter for immunization: Secondary | ICD-10-CM | POA: Diagnosis not present

## 2017-10-04 DIAGNOSIS — E039 Hypothyroidism, unspecified: Secondary | ICD-10-CM | POA: Diagnosis not present

## 2017-10-04 DIAGNOSIS — Z0000 Encounter for general adult medical examination without abnormal findings: Secondary | ICD-10-CM | POA: Diagnosis not present

## 2017-10-04 MED ORDER — ATORVASTATIN CALCIUM 10 MG PO TABS: 10.0000 mg | ORAL_TABLET | Freq: Every day | ORAL | 3 refills | Status: DC

## 2017-10-04 NOTE — Patient Instructions (Signed)
It was great seeing you today   I will follow up with you regarding your blood work   Please continue to stay active and work on cutting back on snacking.   Follow up with me in one year or sooner if needed

## 2017-10-04 NOTE — Progress Notes (Signed)
Subjective:    Patient ID: Angela Houston, female    DOB: March 18, 1951, 67 y.o.   MRN: [id number]  HPI  Patient presents for yearly preventative medicine examination. She is a pleasant 67 year old female who  has a past medical history of Diverticulitis, Hyperlipidemia, Hypothyroidism, Kidney stones, Mitral valve prolapse, Ovarian cyst, and Thyroid disease.   She takes Lipitor for hyperlipidemia   She takes synthroid 112 mcg for hypothyroidism.   All immunizations and health maintenance protocols were reviewed with the patient and needed orders were placed.  Appropriate screening laboratory values were ordered for the patient including screening of hyperlipidemia, renal function and hepatic function.  Medication reconciliation,  past medical history, social history, problem list and allergies were reviewed in detail with the patient  Goals were established with regard to weight loss, exercise, and  diet in compliance with medications.  She stays active with gardening and outside activities, but she does not go to the gym on a regular basis.  She tries to eat healthy with finds that snacking between dinner and bedtime is her biggest issue.She reports that she has been able to lose 5 pounds since the holidays-per her home scale  Wt Readings from Last 3 Encounters:  10/04/17 170 lb (77.1 kg)  02/23/17 170 lb 11.2 oz (77.4 kg)  10/02/16 166 lb 6.4 oz (75.5 kg)     End of life planning was discussed.  She has an advanced directive and living will  She is up to date on mammogram, colonoscopy, and tdap. She does self breast exams, and has not noticed any changes in her breasts .  She participates in routine dental and vision exams  She denies any acute issues today   Review of Systems  Constitutional: Negative.   HENT: Negative.   Eyes: Negative.   Respiratory: Negative.   Cardiovascular: Negative.   Gastrointestinal: Negative.   Endocrine: Negative.   Genitourinary: Negative.     Musculoskeletal: Negative.   Skin: Negative.   Allergic/Immunologic: Negative.   Neurological: Negative.   Hematological: Negative.   Psychiatric/Behavioral: Negative.   All other systems reviewed and are negative.   Past Medical History:  Diagnosis Date  . Diverticulitis   . Hyperlipidemia   . Hypothyroidism   . Kidney stones   . Mitral valve prolapse   . Ovarian cyst   . Thyroid disease     Social History   Socioeconomic History  . Marital status: Married    Spouse name: Not on file  . Number of children: 3  . Years of education: Not on file  . Highest education level: Not on file  Social Needs  . Financial resource strain: Not on file  . Food insecurity - worry: Not on file  . Food insecurity - inability: Not on file  . Transportation needs - medical: Not on file  . Transportation needs - non-medical: Not on file  Occupational History  . Occupation: housewife  Tobacco Use  . Smoking status: Never Smoker  . Smokeless tobacco: Never Used  Substance and Sexual Activity  . Alcohol use: No  . Drug use: No  . Sexual activity: Not on file  Other Topics Concern  . Not on file  Social History Narrative  . Not on file    Past Surgical History:  Procedure Laterality Date  . APPENDECTOMY    . OVARIAN CYST REMOVAL    . TONSILLECTOMY    . TUBAL LIGATION      Family History  Problem Relation Age of Onset  . COPD Father        smoker  . Hyperlipidemia Father   . Glaucoma Father   . Heart disease Father   . Breast cancer Sister   . Colon cancer Neg Hx     Allergies  Allergen Reactions  . Sulfamethoxazole-Trimethoprim     REACTION: fever,headache,chills,rash    Current Outpatient Medications on File Prior to Visit  Medication Sig Dispense Refill  . aspirin 81 MG tablet Take 81 mg by mouth daily.      Marland Kitchen b complex vitamins tablet Take 1 tablet by mouth daily.    . Cholecalciferol (VITAMIN D) 2000 UNITS CAPS Take 2,000 Units by mouth daily.      Marland Kitchen  co-enzyme Q-10 50 MG capsule Take 50 mg by mouth daily.    Marland Kitchen levothyroxine (SYNTHROID, LEVOTHROID) 112 MCG tablet Take 1 tablet (112 mcg total) by mouth daily. 90 tablet 3   No current facility-administered medications on file prior to visit.     BP 120/76 (BP Location: Left Arm)   Temp 98 F (36.7 C) (Oral)   Ht 5\' 6"  (1.676 m)   Wt 170 lb (77.1 kg)   BMI 27.44 kg/m         Objective:   Physical Exam  Constitutional: She is oriented to person, place, and time. She appears well-developed and well-nourished. No distress.  Slightly overweight  HENT:  Head: Normocephalic and atraumatic.  Right Ear: External ear normal.  Left Ear: External ear normal.  Nose: Nose normal.  Mouth/Throat: Oropharynx is clear and moist. No oropharyngeal exudate.  Eyes: Conjunctivae and EOM are normal. Pupils are equal, round, and reactive to light. Right eye exhibits no discharge. Left eye exhibits no discharge. No scleral icterus.  Neck: Normal range of motion. Neck supple. No JVD present. Carotid bruit is not present. No tracheal deviation present. No thyroid mass and no thyromegaly present.  Cardiovascular: Normal rate, regular rhythm, normal heart sounds and intact distal pulses. Exam reveals no gallop and no friction rub.  No murmur heard. Pulmonary/Chest: Effort normal and breath sounds normal. No stridor. No respiratory distress. She has no wheezes. She has no rales. She exhibits no tenderness.  Abdominal: Soft. Bowel sounds are normal. She exhibits no distension and no mass. There is no tenderness. There is no rebound and no guarding.  Musculoskeletal: Normal range of motion. She exhibits no edema, tenderness or deformity.  Lymphadenopathy:    She has no cervical adenopathy.  Neurological: She is alert and oriented to person, place, and time. She has normal reflexes. She displays normal reflexes. No cranial nerve deficit. She exhibits normal muscle tone. Coordination normal.  Skin: Skin is warm  and dry. No rash noted. She is not diaphoretic. No erythema. No pallor.  Psychiatric: She has a normal mood and affect. Her behavior is normal. Judgment and thought content normal.  Nursing note and vitals reviewed.     Assessment & Plan:  1. Routine general medical examination at a health care facility Encouraged routine aerobic exercise at home-  -Encouraged to cut back on snacking between dinner and bedtime -Follow-up in 1 year or sooner if needed - Basic metabolic panel - CBC with Differential/Platelet - Hepatic function panel - Lipid panel - TSH  2. Hypothyroidism, unspecified type -Consider increasing  Synthroid - Basic metabolic panel - CBC with Differential/Platelet - Hepatic function panel - Lipid panel - TSH  3. Hyperlipidemia, unspecified hyperlipidemia type -Encouraged frequent aerobic exercise and a  heart healthy diet - atorvastatin (LIPITOR) 10 MG tablet; Take 1 tablet (10 mg total) by mouth daily.  Dispense: 90 tablet; Refill: 3  4. Need for prophylactic vaccination and inoculation against influenza  - Flu vaccine HIGH DOSE PF (Fluzone High dose)   Dorothyann Peng, NP

## 2017-10-05 ENCOUNTER — Ambulatory Visit: Payer: Medicare HMO

## 2017-10-05 ENCOUNTER — Other Ambulatory Visit: Payer: Self-pay | Admitting: Adult Health

## 2017-10-05 DIAGNOSIS — E038 Other specified hypothyroidism: Secondary | ICD-10-CM

## 2017-10-05 LAB — CBC WITH DIFFERENTIAL/PLATELET
BASOS ABS: 0.1 10*3/uL (ref 0.0–0.1)
Basophils Relative: 1 % (ref 0.0–3.0)
EOS ABS: 0.2 10*3/uL (ref 0.0–0.7)
Eosinophils Relative: 2.6 % (ref 0.0–5.0)
HCT: 43.8 % (ref 36.0–46.0)
Hemoglobin: 15 g/dL (ref 12.0–15.0)
Lymphocytes Relative: 33.7 % (ref 12.0–46.0)
Lymphs Abs: 2.1 10*3/uL (ref 0.7–4.0)
MCHC: 34.2 g/dL (ref 30.0–36.0)
MCV: 90.9 fl (ref 78.0–100.0)
MONO ABS: 0.5 10*3/uL (ref 0.1–1.0)
Monocytes Relative: 8 % (ref 3.0–12.0)
NEUTROS PCT: 54.7 % (ref 43.0–77.0)
Neutro Abs: 3.4 10*3/uL (ref 1.4–7.7)
Platelets: 308 10*3/uL (ref 150.0–400.0)
RBC: 4.82 Mil/uL (ref 3.87–5.11)
RDW: 12.9 % (ref 11.5–15.5)
WBC: 6.2 10*3/uL (ref 4.0–10.5)

## 2017-10-05 LAB — BASIC METABOLIC PANEL
BUN: 19 mg/dL (ref 6–23)
CALCIUM: 9.8 mg/dL (ref 8.4–10.5)
CO2: 30 mEq/L (ref 19–32)
CREATININE: 0.9 mg/dL (ref 0.40–1.20)
Chloride: 102 mEq/L (ref 96–112)
GFR: 66.37 mL/min (ref >60.00)
Glucose, Bld: 97 mg/dL (ref 70–99)
Potassium: 4.2 mEq/L (ref 3.5–5.1)
Sodium: 139 mEq/L (ref 135–145)

## 2017-10-05 LAB — TSH: TSH: 1.22 u[IU]/mL (ref 0.35–4.50)

## 2017-10-05 LAB — HEPATIC FUNCTION PANEL
ALT: 27 U/L (ref 0–35)
AST: 29 U/L (ref 0–37)
Albumin: 4.4 g/dL (ref 3.5–5.2)
Alkaline Phosphatase: 98 U/L (ref 39–117)
BILIRUBIN DIRECT: 0.1 mg/dL (ref 0.0–0.3)
BILIRUBIN TOTAL: 0.8 mg/dL (ref 0.2–1.2)
Total Protein: 6.9 g/dL (ref 6.0–8.3)

## 2017-10-05 LAB — LIPID PANEL
CHOL/HDL RATIO: 4
CHOLESTEROL: 207 mg/dL — AB (ref 0–200)
HDL: 48.2 mg/dL (ref >39.00)
NONHDL: 158.88
Triglycerides: 255 mg/dL — ABNORMAL HIGH (ref 0.0–149.0)
VLDL: 51 mg/dL — AB (ref 0.0–40.0)

## 2017-10-05 LAB — LDL CHOLESTEROL, DIRECT: Direct LDL: 117 mg/dL

## 2017-10-05 MED ORDER — LEVOTHYROXINE SODIUM 112 MCG PO TABS: 112.0000 ug | ORAL_TABLET | Freq: Every day | ORAL | 3 refills | Status: DC

## 2017-10-09 ENCOUNTER — Ambulatory Visit
Admission: RE | Admit: 2017-10-09 | Discharge: 2017-10-09 | Disposition: A | Discharge status: Home / Self Care | Payer: Medicare HMO | Source: Ambulatory Visit | Attending: Adult Health | Admitting: Adult Health

## 2017-10-09 DIAGNOSIS — Z1231 Encounter for screening mammogram for malignant neoplasm of breast: Secondary | ICD-10-CM | POA: Diagnosis not present

## 2017-10-25 ENCOUNTER — Telehealth: Payer: Self-pay | Admitting: Adult Health

## 2017-10-25 DIAGNOSIS — E038 Other specified hypothyroidism: Secondary | ICD-10-CM

## 2017-10-25 MED ORDER — LEVOTHYROXINE SODIUM 112 MCG PO TABS: 112.0000 ug | ORAL_TABLET | Freq: Every day | ORAL | 3 refills | Status: DC

## 2017-10-25 MED ORDER — ATORVASTATIN CALCIUM 20 MG PO TABS: 20.0000 mg | ORAL_TABLET | Freq: Every day | ORAL | 3 refills | Status: DC

## 2017-10-25 NOTE — Telephone Encounter (Signed)
Patient is requesting all prescriptions be sent to [facility]- removed all other from list per patient request.  Lipitor Rx needs to be updated per lab- to 20 mg.   Lipitor 20mg - new Rx dosing Levothyroxine 112 mcg  LOV: 10/04/17   Pharmacy: verified and updated

## 2017-10-25 NOTE — Telephone Encounter (Signed)
Sent to the pharmacy by e-scribe. 

## 2017-10-25 NOTE — Telephone Encounter (Signed)
Copied from [place]. Topic: Quick Communication - Rx Refill/Question >> Oct 25, 2017  9:10 AM Cleaster Corin, NT wrote: Medication: Lipitor and levothyroxine   Has the patient contacted their pharmacy? yes   (Agent: If no, request that the patient contact the pharmacy for the refill.)   Preferred Pharmacy (with phone number or street name): [facility], [place] [address] [place] [address] Phone: [phone] Fax: [phone]     Agent: Please be advised that RX refills may take up to 3 business days. We ask that you follow-up with your pharmacy.

## 2017-11-07 ENCOUNTER — Ambulatory Visit (INDEPENDENT_AMBULATORY_CARE_PROVIDER_SITE_OTHER): Payer: Medicare HMO | Admitting: Family Medicine

## 2017-11-07 ENCOUNTER — Encounter: Payer: Self-pay | Admitting: Family Medicine

## 2017-11-07 VITALS — BP 134/86 | HR 91 | Temp 97.6°F | Resp 12 | Ht 66.0 in | Wt 171.0 lb

## 2017-11-07 DIAGNOSIS — M79605 Pain in left leg: Secondary | ICD-10-CM | POA: Diagnosis not present

## 2017-11-07 DIAGNOSIS — M5442 Lumbago with sciatica, left side: Secondary | ICD-10-CM

## 2017-11-07 MED ORDER — PREDNISONE 20 MG PO TABS: ORAL_TABLET | ORAL | 0 refills | Status: DC

## 2017-11-07 MED ORDER — TIZANIDINE HCL 2 MG PO TABS: 2.0000 mg | ORAL_TABLET | Freq: Three times a day (TID) | ORAL | 0 refills | Status: AC | PRN

## 2017-11-07 NOTE — Patient Instructions (Signed)
Ms.Adilee K Dlugosz I have seen you today for an acute visit.  A few things to remember from today's visit:   Left leg pain - Plan: predniSONE (DELTASONE) 20 MG tablet  Left-sided low back pain with left-sided sciatica, unspecified chronicity - Plan: predniSONE (DELTASONE) 20 MG tablet, tiZANidine (ZANAFLEX) 2 MG tablet   Medications prescribed today are intended for short period of time and will not be refill upon request, a follow up appointment might be necessary to discuss continuation of of treatment if appropriate.     Back pain is very common in adults.The cause of back pain is rarely dangerous and the pain often gets better over time even with no pharmacologic treatment.  The cause of your back pain may not be known. Some common causes of back pain include: 1. Strain of the muscles or ligaments supporting the spine. 2. Wear and tear (degeneration) of the spinal disks. 3. Arthritis. 4. Direct injury to the back.  For many people, back pain may return. Since back pain is rarely dangerous, most people can learn to manage this condition on their own.  HOME CARE INSTRUCTIONS Watch your back pain for any changes. The following actions may help to lessen any discomfort you are feeling:  1. Remain active. It is stressful on your back to sit or stand in one place for long periods of time. Do not sit, drive, or stand in one place for more than 30 minutes at a time. Take short walks on even surfaces as soon as you are able.Try to increase the length of time you walk each day.  2. Exercise regularly as directed by your health care provider. Exercise helps your back heal faster. It also helps avoid future injury by keeping your muscles strong and flexible.  3. Do not stay in bed.Resting more than 1-2 days can delay your recovery.                                                      4. Pay attention to your body when you bend and lift. The most comfortable positions are those that put  less stress on your recovering back.  5.  Always use proper lifting techniques, including: Bending your knees. Keeping the load close to your body. Avoiding twisting.  6. Find a comfortable position to sleep. Use a firm mattress and lie on your side with your knees slightly bent. If you lie on your back, put a pillow under your knees.  7. Over the counter rubbing medications like Icy Hot or Asper cream with Lidocaine may help without significant side effects.  Acetaminophen and/or Aleve/Ibuprofen can be taken if needed and if not contraindications. Local ice and heat may be alternated to reduce pain and spasms. Also massage and even chiropractor treatment.      Muscle relaxants might or might not help, they cause drowsiness among other    side effects. They could also interact with some of medications you may be already taking (medications for depression/anxiety and some pain medications).   8. Maintain a healthy weight. Excess weight puts extra stress on your back and makes it difficult to maintain good posture.   SEEK MEDICAL CARE IF: worsening pain, associated fever, rash/edema on area, pain going to legs or buttocks, numbness/tingling, night pain, or abnormal weight loss.    SEEK  IMMEDIATE MEDICAL CARE IF:  1. You develop new bowel or bladder control problems. 2. You have unusual weakness or numbness in your arms or legs. 3. You develop nausea or vomiting. 4. You develop abdominal pain. 5. You feel faint.     Back Exercises The following exercises strengthen the muscles that help to support the back. They also help to keep the lower back flexible. Doing these exercises can help to prevent back pain or lessen existing pain. If you have back pain or discomfort, try doing these exercises 2-3 times each day or as told by your health care provider. When the pain goes away, do them once each day, but increase the number of times that you repeat the steps for each exercise (do more  repetitions). If you do not have back pain or discomfort, do these exercises once each day or as told by your health care provider.   EXERCISES Single Knee to Chest Repeat these steps 3-5 times for each leg: 5. Lie on your back on a firm bed or the floor with your legs extended. 6. Bring one knee to your chest. Your other leg should stay extended and in contact with the floor. 7. Hold your knee in place by grabbing your knee or thigh. 8. Pull on your knee until you feel a gentle stretch in your lower back. 9. Hold the stretch for 10-30 seconds. 10. Slowly release and straighten your leg.  Pelvic Tilt Repeat these steps 5-10 times: 2. Lie on your back on a firm bed or the floor with your legs extended. 3. Bend your knees so they are pointing toward the ceiling and your feet are flat on the floor. 4. Tighten your lower abdominal muscles to press your lower back against the floor. This motion will tilt your pelvis so your tailbone points up toward the ceiling instead of pointing to your feet or the floor. 5. With gentle tension and even breathing, hold this position for 5-10 seconds.  Cat-Cow Repeat these steps until your lower back becomes more flexible: 1. Get into a hands-and-knees position on a firm surface. Keep your hands under your shoulders, and keep your knees under your hips. You may place padding under your knees for comfort. 2. Let your head hang down, and point your tailbone toward the floor so your lower back becomes rounded like the back of a cat. 3. Hold this position for 5 seconds. 4. Slowly lift your head and point your tailbone up toward the ceiling so your back forms a sagging arch like the back of a cow. 5. Hold this position for 5 seconds.   Press-Ups Repeat these steps 5-10 times: 6. Lie on your abdomen (face-down) on the floor. 7. Place your palms near your head, about shoulder-width apart. 8. While you keep your back as relaxed as possible and keep your hips on  the floor, slowly straighten your arms to raise the top half of your body and lift your shoulders. Do not use your back muscles to raise your upper torso. You may adjust the placement of your hands to make yourself more comfortable. 9. Hold this position for 5 seconds while you keep your back relaxed. 10. Slowly return to lying flat on the floor.   Bridges Repeat these steps 10 times: 1. Lie on your back on a firm surface. 2. Bend your knees so they are pointing toward the ceiling and your feet are flat on the floor. 3. Tighten your buttocks muscles and lift your buttocks  off of the floor until your waist is at almost the same height as your knees. You should feel the muscles working in your buttocks and the back of your thighs. If you do not feel these muscles, slide your feet 1-2 inches farther away from your buttocks. 4. Hold this position for 3-5 seconds. 5. Slowly lower your hips to the starting position, and allow your buttocks muscles to relax completely. If this exercise is too easy, try doing it with your arms crossed over your chest.     In general please monitor for signs of worsening symptoms and seek immediate medical attention if any concerning.   I hope you get better soon!

## 2017-11-07 NOTE — Progress Notes (Signed)
ACUTE VISIT   HPI:  Chief Complaint  Patient presents with  . Leg Pain    left leg/sciatic pain, injury on 10/16/17    Angela Houston is a 67 y.o. female, who is here today complaining of LLE pain since 10/16/17. She has Hx of similar problems in the past, sciatic pain. Intermittent burning,numb,tingling pain from left buttock to left feet. She has not noted back pain.  No history of recent direct injury or fall; but she mentions that the pain started when she was working in her garden, chopping a bush.She felt pain while she was doing so and worsening since then.  Pain is "20/10" , exacerbated by movement,lying down,getting up,and walking. Alleviated by rest.  She has not noted rash or skin changes on affected area, saddle anesthesia, urine/bowel incontinence, or focal weakness.  She has taken OTC Tylenol.   Review of Systems  Constitutional: Positive for fatigue. Negative for activity change, appetite change and fever.  HENT: Negative for mouth sores.   Respiratory: Negative for shortness of breath and wheezing.   Cardiovascular: Negative for leg swelling.  Gastrointestinal: Negative for abdominal pain, nausea and vomiting.       Negative for changes in bowel habits.  Genitourinary: Negative for decreased urine volume, dysuria and hematuria.  Musculoskeletal: Positive for back pain. Negative for joint swelling.  Skin: Negative for rash and wound.  Neurological: Positive for numbness. Negative for weakness and headaches.  Hematological: Negative for adenopathy. Does not bruise/bleed easily.  Psychiatric/Behavioral: Negative for confusion. The patient is nervous/anxious.       Current Outpatient Medications on File Prior to Visit  Medication Sig Dispense Refill  . aspirin 81 MG tablet Take 81 mg by mouth daily.      Marland Kitchen atorvastatin (LIPITOR) 20 MG tablet Take 1 tablet (20 mg total) by mouth daily. 90 tablet 3  . b complex vitamins tablet Take 1 tablet by mouth  daily.    . Cholecalciferol (VITAMIN D) 2000 UNITS CAPS Take 2,000 Units by mouth daily.      Marland Kitchen co-enzyme Q-10 50 MG capsule Take 50 mg by mouth daily.    Marland Kitchen levothyroxine (SYNTHROID, LEVOTHROID) 112 MCG tablet Take 1 tablet (112 mcg total) by mouth daily. 90 tablet 3   No current facility-administered medications on file prior to visit.      Past Medical History:  Diagnosis Date  . Diverticulitis   . Hyperlipidemia   . Hypothyroidism   . Kidney stones   . Mitral valve prolapse   . Ovarian cyst   . Thyroid disease    Allergies  Allergen Reactions  . Sulfamethoxazole-Trimethoprim     REACTION: fever,headache,chills,rash    Social History   Socioeconomic History  . Marital status: Married    Spouse name: None  . Number of children: 3  . Years of education: None  . Highest education level: None  Social Needs  . Financial resource strain: None  . Food insecurity - worry: None  . Food insecurity - inability: None  . Transportation needs - medical: None  . Transportation needs - non-medical: None  Occupational History  . Occupation: housewife  Tobacco Use  . Smoking status: Never Smoker  . Smokeless tobacco: Never Used  Substance and Sexual Activity  . Alcohol use: No  . Drug use: No  . Sexual activity: None  Other Topics Concern  . None  Social History Narrative  . None    Vitals:   11/07/17 1601  BP: 134/86  Pulse: 91  Resp: 12  Temp: 97.6 F (36.4 C)  SpO2: 100%   Body mass index is 27.6 kg/m.   Physical Exam  Nursing note and vitals reviewed. Constitutional: She is oriented to person, place, and time. She appears well-developed. She does not appear ill. She appears distressed (due to pain.).  HENT:  Head: Normocephalic and atraumatic.  Eyes: Conjunctivae are normal.  Cardiovascular: Normal rate and regular rhythm.  Pulses:      Dorsalis pedis pulses are 2+ on the right side, and 2+ on the left side.  Respiratory: Effort normal and breath sounds  normal. No respiratory distress.  GI: Soft. She exhibits no mass. There is no tenderness.  Musculoskeletal: She exhibits tenderness. She exhibits no edema.       Lumbar back: She exhibits decreased range of motion and tenderness. She exhibits no bony tenderness.       Back:  Tenderness upon palpation of paraspinal muscles, left sacral area. Pain elicited with movement on exam table during examination.   Neurological: She is alert and oriented to person, place, and time. She has normal strength.  Reflex Scores:      Patellar reflexes are 2+ on the right side and 2+ on the left side.      Achilles reflexes are 2+ on the right side and 2+ on the left side. Antalgic gait.   SLR elicits left buttock pain.  Skin: Skin is warm. No rash noted. No erythema.  Psychiatric: She has a normal mood and affect.  Well groomed, good eye contact.    ASSESSMENT AND PLAN:  Angela Houston was seen today for leg pain.  Diagnoses and all orders for this visit:  Left leg pain  Possible etiologies discussed, including vascular and radicular/neuropathic. Instructed about warning signs.  -     predniSONE (DELTASONE) 20 MG tablet; 3 tabs for 3 days, 2 tabs for 3 days, 1 tabs for 3 days, and 1/2 tab for 3 days. Take tables together with breakfast.  Left-sided low back pain with left-sided sciatica, unspecified chronicity  She is not interested in Depo-Medrol here in the office or Toradol.  I do not think imaging is necessary at this time.  Side effects of Prednisone and Zanaflex discussed. Fall precautions. Relative rest. Instructed to avoid activities that could exacerbate pain. Follow-up with PCP in 2 weeks, before if needed.  -     predniSONE (DELTASONE) 20 MG tablet; 3 tabs for 3 days, 2 tabs for 3 days, 1 tabs for 3 days, and 1/2 tab for 3 days. Take tables together with breakfast. -     tiZANidine (ZANAFLEX) 2 MG tablet; Take 1 tablet (2 mg total) by mouth every 8 (eight) hours as needed for up to 10  days for muscle spasms.     Return in about 2 weeks (around 11/21/2017) for Leg pain with PCP.     -Angela Houston was advised to seek immediate medical attention if sudden worsening symptoms.     Betty G. Martinique, MD  [facility]. [place] office.

## 2017-11-21 DIAGNOSIS — Z0100 Encounter for examination of eyes and vision without abnormal findings: Secondary | ICD-10-CM | POA: Diagnosis not present

## 2017-11-23 ENCOUNTER — Ambulatory Visit (INDEPENDENT_AMBULATORY_CARE_PROVIDER_SITE_OTHER): Payer: Medicare HMO | Admitting: Adult Health

## 2017-11-23 ENCOUNTER — Encounter: Payer: Self-pay | Admitting: Adult Health

## 2017-11-23 VITALS — BP 126/82 | Temp 98.1°F | Wt 170.0 lb

## 2017-11-23 DIAGNOSIS — M5416 Radiculopathy, lumbar region: Secondary | ICD-10-CM | POA: Diagnosis not present

## 2017-11-23 MED ORDER — PREDNISONE 20 MG PO TABS: ORAL_TABLET | ORAL | 0 refills | Status: AC

## 2017-11-23 NOTE — Progress Notes (Signed)
Subjective:    Patient ID: Angela Houston, female    DOB: 1950/12/31, 67 y.o.   MRN: [id number]  HPI  67 year old female who  has a past medical history of Diverticulitis, Hyperlipidemia, Hypothyroidism, Kidney stones, Mitral valve prolapse, Ovarian cyst, and Thyroid disease.  She presents to the office today for follow up regarding low back pain with radiating pain down left leg. Her symptoms have been present since 10/16/2017. She was seen by another provider and was prescribed a 12 day steroid pack. And Zanaflex. She reports that she noticed some improvement in her symptoms with the prednisone. When she took the Zanaflex she had a sensation of her throat tightening but did not have any issues with breathing.   Reports pain is worse with walking . Better with sitting.   Does reports numbness and tingling that radiates down the left side of her leg when walking.   She denies any issues with bowel or bladder.   Review of Systems See HPI   Past Medical History:  Diagnosis Date  . Diverticulitis   . Hyperlipidemia   . Hypothyroidism   . Kidney stones   . Mitral valve prolapse   . Ovarian cyst   . Thyroid disease     Social History   Socioeconomic History  . Marital status: Married    Spouse name: Not on file  . Number of children: 3  . Years of education: Not on file  . Highest education level: Not on file  Social Needs  . Financial resource strain: Not on file  . Food insecurity - worry: Not on file  . Food insecurity - inability: Not on file  . Transportation needs - medical: Not on file  . Transportation needs - non-medical: Not on file  Occupational History  . Occupation: housewife  Tobacco Use  . Smoking status: Never Smoker  . Smokeless tobacco: Never Used  Substance and Sexual Activity  . Alcohol use: No  . Drug use: No  . Sexual activity: Not on file  Other Topics Concern  . Not on file  Social History Narrative  . Not on file    Past Surgical History:    Procedure Laterality Date  . APPENDECTOMY    . BREAST EXCISIONAL BIOPSY Right   . OVARIAN CYST REMOVAL    . TONSILLECTOMY    . TUBAL LIGATION      Family History  Problem Relation Age of Onset  . COPD Father        smoker  . Hyperlipidemia Father   . Glaucoma Father   . Heart disease Father   . Breast cancer Sister   . Colon cancer Neg Hx     Allergies  Allergen Reactions  . Sulfamethoxazole-Trimethoprim     REACTION: fever,headache,chills,rash    Current Outpatient Medications on File Prior to Visit  Medication Sig Dispense Refill  . aspirin 81 MG tablet Take 81 mg by mouth daily.      Marland Kitchen atorvastatin (LIPITOR) 20 MG tablet Take 1 tablet (20 mg total) by mouth daily. 90 tablet 3  . b complex vitamins tablet Take 1 tablet by mouth daily.    . Cholecalciferol (VITAMIN D) 2000 UNITS CAPS Take 2,000 Units by mouth daily.      Marland Kitchen co-enzyme Q-10 50 MG capsule Take 50 mg by mouth daily.    Marland Kitchen levothyroxine (SYNTHROID, LEVOTHROID) 112 MCG tablet Take 1 tablet (112 mcg total) by mouth daily. 90 tablet 3   No current  facility-administered medications on file prior to visit.     BP 126/82 (BP Location: Left Arm)   Temp 98.1 F (36.7 C) (Oral)   Wt 170 lb (77.1 kg)   BMI 27.44 kg/m       Objective:   Physical Exam  Constitutional: She is oriented to person, place, and time. She appears well-developed and well-nourished. No distress.  Cardiovascular: Normal rate, regular rhythm and intact distal pulses. Exam reveals no gallop.  No murmur heard. Pulmonary/Chest: Effort normal and breath sounds normal. No respiratory distress. She has no wheezes. She has no rales. She exhibits no tenderness.  Musculoskeletal:  np spinal tenderness or tenderness down sciatic nerve/IT band.   Pain in low back and numbness/tingling down left leg with straight leg raise and internal rotation. No pain with external rotation.   Neurological: She is alert and oriented to person, place, and time.   Skin: Skin is warm and dry. No rash noted. She is not diaphoretic. No erythema. No pallor.  Psychiatric: She has a normal mood and affect. Her behavior is normal. Thought content normal.  Nursing note and vitals reviewed.     Assessment & Plan:  1. Lumbar radiculopathy - We discussed options. She did not want a Medrol or Toradol injection in the office.  - MR Lumbar Spine Wo Contrast; Future - predniSONE (DELTASONE) 20 MG tablet; 3 tabs for 3 days, 2 tabs for 3 days, 1 tabs for 3 days, and 1/2 tab for 3 days. Take tables together with breakfast.  Dispense: 20 tablet; Refill: 0 - Consider PT, I think this would be good for her symptoms but she would like to hold off until MRI is complete  Dorothyann Peng, NP

## 2017-11-23 NOTE — Progress Notes (Signed)
Subjective:    Patient ID: Angela Houston, female    DOB: Aug 07, 1951, 67 y.o.   MRN: [id number]  HPI Patient presents for follow up of continued left buttocks pain that radiates down the leg into the left foot with numbness and tingling. She denies any recent injury.  She was seen 02/27 by another provider and prescribed 12 day steroid taper and Zanaflex.  She was unable to take the Zanaflex, as she said it made her throat feel like it was swelling without difficulty breathing.  She had some relief from the the steroids in the lower back but no relief of the sciatica or numbness and tingling.  She has previously been seen by Dr Durene Cal for low back pain and was prescribed exercises and was able to avoid surgery.  She has never tried any other muscle relaxers and has not gotten any relief from Motrin or Tylenol.   Review of Systems  Constitutional: Positive for activity change. Negative for chills, fatigue, fever and unexpected weight change.  Respiratory: Negative.   Cardiovascular: Negative.   Musculoskeletal: Positive for back pain and gait problem. Negative for joint swelling, neck pain and neck stiffness.   Past Medical History:  Diagnosis Date  . Diverticulitis   . Hyperlipidemia   . Hypothyroidism   . Kidney stones   . Mitral valve prolapse   . Ovarian cyst   . Thyroid disease     Social History   Socioeconomic History  . Marital status: Married    Spouse name: Not on file  . Number of children: 3  . Years of education: Not on file  . Highest education level: Not on file  Social Needs  . Financial resource strain: Not on file  . Food insecurity - worry: Not on file  . Food insecurity - inability: Not on file  . Transportation needs - medical: Not on file  . Transportation needs - non-medical: Not on file  Occupational History  . Occupation: housewife  Tobacco Use  . Smoking status: Never Smoker  . Smokeless tobacco: Never Used  Substance and Sexual Activity  .  Alcohol use: No  . Drug use: No  . Sexual activity: Not on file  Other Topics Concern  . Not on file  Social History Narrative  . Not on file    Past Surgical History:  Procedure Laterality Date  . APPENDECTOMY    . BREAST EXCISIONAL BIOPSY Right   . OVARIAN CYST REMOVAL    . TONSILLECTOMY    . TUBAL LIGATION      Family History  Problem Relation Age of Onset  . COPD Father        smoker  . Hyperlipidemia Father   . Glaucoma Father   . Heart disease Father   . Breast cancer Sister   . Colon cancer Neg Hx     Allergies  Allergen Reactions  . Sulfamethoxazole-Trimethoprim     REACTION: fever,headache,chills,rash    Current Outpatient Medications on File Prior to Visit  Medication Sig Dispense Refill  . aspirin 81 MG tablet Take 81 mg by mouth daily.      Marland Kitchen atorvastatin (LIPITOR) 20 MG tablet Take 1 tablet (20 mg total) by mouth daily. 90 tablet 3  . b complex vitamins tablet Take 1 tablet by mouth daily.    . Cholecalciferol (VITAMIN D) 2000 UNITS CAPS Take 2,000 Units by mouth daily.      Marland Kitchen co-enzyme Q-10 50 MG capsule Take 50 mg by mouth  daily.    . levothyroxine (SYNTHROID, LEVOTHROID) 112 MCG tablet Take 1 tablet (112 mcg total) by mouth daily. 90 tablet 3   No current facility-administered medications on file prior to visit.     BP 126/82 (BP Location: Left Arm)   Temp 98.1 F (36.7 C) (Oral)   Wt 170 lb (77.1 kg)   BMI 27.44 kg/m      Objective:   Physical Exam  Constitutional: She is oriented to person, place, and time. She appears well-developed and well-nourished. No distress.  Cardiovascular: Normal rate, regular rhythm, normal heart sounds and intact distal pulses. Exam reveals no gallop and no friction rub.  No murmur heard. Pulmonary/Chest: Effort normal and breath sounds normal. No respiratory distress. She has no wheezes. She has no rales.  Musculoskeletal:       Back:  Tenderness to palpation in the left buttocks.  Pain in the buttocks  with straight leg raise of the left leg.  She has pain in this same area with left hip adduction and no pain with left hip abduction. There is no palpable tenderness or tight muscle noted in the left IT or greater trochanter area.   Neurological: She is alert and oriented to person, place, and time. She has normal strength.  Reflex Scores:      Patellar reflexes are 3+ on the right side and 3+ on the left side. Skin: She is not diaphoretic.  Nursing note and vitals reviewed.     Assessment & Plan:  1. Lumbar radiculopathy Continue with heat and cold and gentle stretches.  Will check radiology studies and notify of results and where we need to go from there. - MR Lumbar Spine Wo Contrast; Future - predniSONE (DELTASONE) 20 MG tablet; 3 tabs for 3 days, 2 tabs for 3 days, 1 tabs for 3 days, and 1/2 tab for 3 days. Take tables together with breakfast.  Dispense: 20 tablet; Refill: 0  Follow-up as needed.  Novak Stgermaine C Kiri Hinderliter BSN RN NP student

## 2017-11-27 ENCOUNTER — Telehealth: Payer: Self-pay | Admitting: Adult Health

## 2017-11-27 NOTE — Telephone Encounter (Signed)
Copied from [place]. Topic: Quick Communication - See Telephone Encounter >> Nov 27, 2017  8:37 AM Corie Chiquito, NT wrote: CRM for notification. Patient is calling to check the status of her MRI. She stated that she hasn't heard anything back about getting her appointment set up. She would like to speak with someone about this and can be reached at [phone]

## 2017-11-27 NOTE — Telephone Encounter (Signed)
I spoke with pt and explained that referral coordinator is working on this.

## 2017-11-30 ENCOUNTER — Other Ambulatory Visit: Payer: Self-pay | Admitting: Family Medicine

## 2017-11-30 DIAGNOSIS — M5416 Radiculopathy, lumbar region: Secondary | ICD-10-CM

## 2017-11-30 NOTE — Telephone Encounter (Signed)
Patient wants the referral coordinator to know she would like to be sent to [facility] for the MRI.

## 2017-12-06 NOTE — Telephone Encounter (Signed)
Copied from [place]. Topic: Quick Communication - See Telephone Encounter >> Dec 06, 2017  9:23 AM Ether Griffins B wrote: CRM for notification. See Telephone encounter for: 12/06/17. Pilar Plate with [facility] calling to advise the PA for the MRI of lumbar has been denied. Call back number [phone]. Reference # [id number].

## 2017-12-06 NOTE — Telephone Encounter (Signed)
Please review previous response from [facility]

## 2017-12-06 NOTE — Telephone Encounter (Signed)
General 12/03/2017 4:38 PM Nonah Mattes A - -  Note   Pt scheduled  For 12/10/2017@10 :30 voyteck Pt informed spoke with pt  [facility] Address: [address], [place], [place] [address] Phone: [phone]

## 2017-12-10 DIAGNOSIS — M25552 Pain in left hip: Secondary | ICD-10-CM | POA: Diagnosis not present

## 2017-12-10 DIAGNOSIS — M5432 Sciatica, left side: Secondary | ICD-10-CM | POA: Diagnosis not present

## 2017-12-10 DIAGNOSIS — M5136 Other intervertebral disc degeneration, lumbar region: Secondary | ICD-10-CM | POA: Diagnosis not present

## 2017-12-13 ENCOUNTER — Telehealth: Payer: Self-pay | Admitting: Adult Health

## 2017-12-13 NOTE — Telephone Encounter (Signed)
Copied from [place] #[id number]. Topic: General - Other >> Dec 13, 2017 11:46 AM Darl Householder, RMA wrote: Reason for CRM: Patient is requesting a call back concerning insurance denial of MRI

## 2017-12-14 NOTE — Telephone Encounter (Signed)
Pt notified to go to after hours clinic/uc at [facility] if she does not hear back from the office today.  Pt agreed to plan.

## 2017-12-14 NOTE — Telephone Encounter (Signed)
Spoke to pt.  She informed me that she has seen Dr. Lynann Bologna at [facility].  Dr. Lynann Bologna ordered imaging that was denied because the pt has not had "therapy."  Informed her that the insurance company was most likely referring to physical therapy.  Pt did not know what kind of therapy was needed.  She made a phone call to Dr. Laurena Bering office yesterday to be worked in on the schedule.  Pain has worsened.  She was given floor exercises to do.  Was able to do them for 2-3 days and found them helpful but then pain worsened and can no longer do them.  Pt currently waiting on a phone call from [facility] for appointment.  Will forward to [facility] as [facility].

## 2017-12-14 NOTE — Telephone Encounter (Signed)
If she does not hear back there is always the urgent care clinic from 5:30 - 9 pm at [facility]

## 2017-12-22 DIAGNOSIS — M545 Low back pain: Secondary | ICD-10-CM | POA: Diagnosis not present

## 2017-12-25 DIAGNOSIS — R69 Illness, unspecified: Secondary | ICD-10-CM | POA: Diagnosis not present

## 2018-01-04 DIAGNOSIS — M549 Dorsalgia, unspecified: Secondary | ICD-10-CM | POA: Diagnosis not present

## 2018-01-04 DIAGNOSIS — M47816 Spondylosis without myelopathy or radiculopathy, lumbar region: Secondary | ICD-10-CM | POA: Diagnosis not present

## 2018-01-04 DIAGNOSIS — M5416 Radiculopathy, lumbar region: Secondary | ICD-10-CM | POA: Diagnosis not present

## 2018-01-04 DIAGNOSIS — M5136 Other intervertebral disc degeneration, lumbar region: Secondary | ICD-10-CM | POA: Diagnosis not present

## 2018-01-04 DIAGNOSIS — M546 Pain in thoracic spine: Secondary | ICD-10-CM | POA: Diagnosis not present

## 2018-01-04 DIAGNOSIS — M48062 Spinal stenosis, lumbar region with neurogenic claudication: Secondary | ICD-10-CM | POA: Diagnosis not present

## 2018-01-04 DIAGNOSIS — Z6826 Body mass index (BMI) 26.0-26.9, adult: Secondary | ICD-10-CM | POA: Diagnosis not present

## 2018-01-04 DIAGNOSIS — M5126 Other intervertebral disc displacement, lumbar region: Secondary | ICD-10-CM | POA: Diagnosis not present

## 2018-01-04 DIAGNOSIS — R030 Elevated blood-pressure reading, without diagnosis of hypertension: Secondary | ICD-10-CM | POA: Diagnosis not present

## 2018-02-13 DIAGNOSIS — M5136 Other intervertebral disc degeneration, lumbar region: Secondary | ICD-10-CM | POA: Diagnosis not present

## 2018-02-13 DIAGNOSIS — M4726 Other spondylosis with radiculopathy, lumbar region: Secondary | ICD-10-CM | POA: Diagnosis not present

## 2018-02-13 DIAGNOSIS — M5127 Other intervertebral disc displacement, lumbosacral region: Secondary | ICD-10-CM | POA: Diagnosis not present

## 2018-02-13 DIAGNOSIS — M5116 Intervertebral disc disorders with radiculopathy, lumbar region: Secondary | ICD-10-CM | POA: Diagnosis not present

## 2018-02-13 DIAGNOSIS — M5117 Intervertebral disc disorders with radiculopathy, lumbosacral region: Secondary | ICD-10-CM | POA: Diagnosis not present

## 2018-04-29 DIAGNOSIS — R1013 Epigastric pain: Secondary | ICD-10-CM | POA: Diagnosis not present

## 2018-04-29 DIAGNOSIS — K5730 Diverticulosis of large intestine without perforation or abscess without bleeding: Secondary | ICD-10-CM | POA: Diagnosis not present

## 2018-05-07 ENCOUNTER — Encounter: Payer: Self-pay | Admitting: Family Medicine

## 2018-06-03 ENCOUNTER — Ambulatory Visit (INDEPENDENT_AMBULATORY_CARE_PROVIDER_SITE_OTHER): Payer: Medicare HMO | Admitting: Family Medicine

## 2018-06-03 ENCOUNTER — Encounter: Payer: Self-pay | Admitting: Family Medicine

## 2018-06-03 VITALS — BP 110/64 | HR 73 | Temp 97.9°F | Wt 177.4 lb

## 2018-06-03 DIAGNOSIS — J209 Acute bronchitis, unspecified: Secondary | ICD-10-CM | POA: Diagnosis not present

## 2018-06-03 MED ORDER — AZITHROMYCIN 250 MG PO TABS: ORAL_TABLET | ORAL | 0 refills | Status: DC

## 2018-06-03 NOTE — Progress Notes (Signed)
   Subjective:    Patient ID: Angela Houston, female    DOB: October 22, 1950, 67 y.o.   MRN: [id number]  HPI Here for 10 days of chest tightness and coughing up yellow sputum. No fever. On Mucinex DM.    Review of Systems  Constitutional: Negative.   HENT: Negative.   Eyes: Negative.   Respiratory: Positive for cough and chest tightness. Negative for shortness of breath and wheezing.        Objective:   Physical Exam  Constitutional: She appears well-developed and well-nourished. No distress.  HENT:  Right Ear: External ear normal.  Left Ear: External ear normal.  Nose: Nose normal.  Mouth/Throat: Oropharynx is clear and moist.  Eyes: Conjunctivae are normal.  Neck: No thyromegaly present.  Cardiovascular: Normal rate, regular rhythm, normal heart sounds and intact distal pulses.  Pulmonary/Chest: Effort normal. No stridor. No respiratory distress. She has no wheezes. She has no rales.  Scattered rhonchi   Lymphadenopathy:    She has no cervical adenopathy.          Assessment & Plan:  Bronchitis, treat with a Zpack.  Alysia Penna, MD

## 2018-06-11 DIAGNOSIS — M47816 Spondylosis without myelopathy or radiculopathy, lumbar region: Secondary | ICD-10-CM | POA: Diagnosis not present

## 2018-06-11 DIAGNOSIS — M4316 Spondylolisthesis, lumbar region: Secondary | ICD-10-CM | POA: Diagnosis not present

## 2018-06-11 DIAGNOSIS — Z9889 Other specified postprocedural states: Secondary | ICD-10-CM | POA: Diagnosis not present

## 2018-06-11 DIAGNOSIS — M48062 Spinal stenosis, lumbar region with neurogenic claudication: Secondary | ICD-10-CM | POA: Diagnosis not present

## 2018-06-11 DIAGNOSIS — M545 Low back pain: Secondary | ICD-10-CM | POA: Diagnosis not present

## 2018-06-11 DIAGNOSIS — M5136 Other intervertebral disc degeneration, lumbar region: Secondary | ICD-10-CM | POA: Diagnosis not present

## 2018-06-27 DIAGNOSIS — M546 Pain in thoracic spine: Secondary | ICD-10-CM | POA: Diagnosis not present

## 2018-06-27 DIAGNOSIS — Z6827 Body mass index (BMI) 27.0-27.9, adult: Secondary | ICD-10-CM | POA: Diagnosis not present

## 2018-06-27 DIAGNOSIS — R030 Elevated blood-pressure reading, without diagnosis of hypertension: Secondary | ICD-10-CM | POA: Diagnosis not present

## 2018-07-16 DIAGNOSIS — R69 Illness, unspecified: Secondary | ICD-10-CM | POA: Diagnosis not present

## 2018-07-31 DIAGNOSIS — Z6827 Body mass index (BMI) 27.0-27.9, adult: Secondary | ICD-10-CM | POA: Diagnosis not present

## 2018-07-31 DIAGNOSIS — M47816 Spondylosis without myelopathy or radiculopathy, lumbar region: Secondary | ICD-10-CM | POA: Diagnosis not present

## 2018-07-31 DIAGNOSIS — Z9889 Other specified postprocedural states: Secondary | ICD-10-CM | POA: Diagnosis not present

## 2018-07-31 DIAGNOSIS — M5442 Lumbago with sciatica, left side: Secondary | ICD-10-CM | POA: Diagnosis not present

## 2018-07-31 DIAGNOSIS — M5416 Radiculopathy, lumbar region: Secondary | ICD-10-CM | POA: Diagnosis not present

## 2018-07-31 DIAGNOSIS — M5136 Other intervertebral disc degeneration, lumbar region: Secondary | ICD-10-CM | POA: Diagnosis not present

## 2018-07-31 DIAGNOSIS — M48062 Spinal stenosis, lumbar region with neurogenic claudication: Secondary | ICD-10-CM | POA: Diagnosis not present

## 2018-07-31 DIAGNOSIS — R030 Elevated blood-pressure reading, without diagnosis of hypertension: Secondary | ICD-10-CM | POA: Diagnosis not present

## 2018-08-13 DIAGNOSIS — M5136 Other intervertebral disc degeneration, lumbar region: Secondary | ICD-10-CM | POA: Diagnosis not present

## 2018-08-13 DIAGNOSIS — M4316 Spondylolisthesis, lumbar region: Secondary | ICD-10-CM | POA: Diagnosis not present

## 2018-08-13 DIAGNOSIS — M47816 Spondylosis without myelopathy or radiculopathy, lumbar region: Secondary | ICD-10-CM | POA: Diagnosis not present

## 2018-08-13 DIAGNOSIS — M48062 Spinal stenosis, lumbar region with neurogenic claudication: Secondary | ICD-10-CM | POA: Diagnosis not present

## 2018-08-15 DIAGNOSIS — M5126 Other intervertebral disc displacement, lumbar region: Secondary | ICD-10-CM | POA: Diagnosis not present

## 2018-08-15 DIAGNOSIS — M48062 Spinal stenosis, lumbar region with neurogenic claudication: Secondary | ICD-10-CM | POA: Diagnosis not present

## 2018-08-20 ENCOUNTER — Other Ambulatory Visit: Payer: Self-pay | Admitting: Neurosurgery

## 2018-08-20 DIAGNOSIS — M5416 Radiculopathy, lumbar region: Secondary | ICD-10-CM | POA: Diagnosis not present

## 2018-08-20 DIAGNOSIS — M4316 Spondylolisthesis, lumbar region: Secondary | ICD-10-CM | POA: Diagnosis not present

## 2018-08-20 DIAGNOSIS — M5136 Other intervertebral disc degeneration, lumbar region: Secondary | ICD-10-CM | POA: Diagnosis not present

## 2018-08-20 DIAGNOSIS — M5126 Other intervertebral disc displacement, lumbar region: Secondary | ICD-10-CM | POA: Diagnosis not present

## 2018-08-20 DIAGNOSIS — M48062 Spinal stenosis, lumbar region with neurogenic claudication: Secondary | ICD-10-CM | POA: Diagnosis not present

## 2018-08-20 DIAGNOSIS — M47816 Spondylosis without myelopathy or radiculopathy, lumbar region: Secondary | ICD-10-CM | POA: Diagnosis not present

## 2018-08-20 DIAGNOSIS — M5442 Lumbago with sciatica, left side: Secondary | ICD-10-CM | POA: Diagnosis not present

## 2018-08-20 DIAGNOSIS — Z9889 Other specified postprocedural states: Secondary | ICD-10-CM | POA: Diagnosis not present

## 2018-08-27 DIAGNOSIS — H524 Presbyopia: Secondary | ICD-10-CM | POA: Diagnosis not present

## 2018-08-27 DIAGNOSIS — H43813 Vitreous degeneration, bilateral: Secondary | ICD-10-CM | POA: Diagnosis not present

## 2018-08-27 DIAGNOSIS — H5203 Hypermetropia, bilateral: Secondary | ICD-10-CM | POA: Diagnosis not present

## 2018-08-27 DIAGNOSIS — H2513 Age-related nuclear cataract, bilateral: Secondary | ICD-10-CM | POA: Diagnosis not present

## 2018-09-09 ENCOUNTER — Other Ambulatory Visit: Payer: Self-pay | Admitting: Neurosurgery

## 2018-09-12 NOTE — Pre-Procedure Instructions (Signed)
Angela Houston  09/12/2018      [facility] - [place], [place] - [place] [address] Phone: [phone] Fax: [phone]  [id number] - [place], [place] [address] Phone: [phone] Fax: [phone]    Your procedure is scheduled on January 9  Report to [facility] at 0830 A.M.  Call this number if you have problems the morning of surgery:  [phone]   Remember:  Do not eat or drink after midnight.      Take these medicines the morning of surgery with A SIP OF WATER  dicyclomine (BENTYL) levothyroxine (SYNTHROID, LEVOTHROID)  Eye drops if needed  7 days prior to surgery STOP taking any Aspirin (unless otherwise instructed by your surgeon), Aleve, Naproxen, Ibuprofen, Motrin, Advil, Goody's, BC's, all herbal medications, fish oil, and all vitamins.  Follow your surgeon's instructions on when to stop Asprin.  If no instructions were given by your surgeon then you will need to call the office to get those instructions.     Do not wear jewelry, make-up or nail polish.  Do not wear lotions, powders, or perfumes, or deodorant.  Do not shave 48 hours prior to surgery.   Do not bring valuables to the hospital.  [facility] is not responsible for any belongings or valuables.  Contacts, dentures or bridgework may not be worn into surgery.  Leave your suitcase in the car.  After surgery it may be brought to your room.  For patients admitted to the hospital, discharge time will be determined by your treatment team.  Patients discharged the day of surgery will not be allowed to drive home.    Special instructions:   [facility]- Preparing For Surgery  Before surgery, you can play an important role. Because skin is not sterile, your skin needs to be as free of germs as possible. You can reduce the number of germs on your  skin by washing with CHG (chlorahexidine gluconate) Soap before surgery.  CHG is an antiseptic cleaner which kills germs and bonds with the skin to continue killing germs even after washing.    Oral Hygiene is also important to reduce your risk of infection.  Remember - BRUSH YOUR TEETH THE MORNING OF SURGERY WITH YOUR REGULAR TOOTHPASTE  Please do not use if you have an allergy to CHG or antibacterial soaps. If your skin becomes reddened/irritated stop using the CHG.  Do not shave (including legs and underarms) for at least 48 hours prior to first CHG shower. It is OK to shave your face.  Please follow these instructions carefully.   1. Shower the NIGHT BEFORE SURGERY and the MORNING OF SURGERY with CHG.   2. If you chose to wash your hair, wash your hair first as usual with your normal shampoo.  3. After you shampoo, rinse your hair and body thoroughly to remove the shampoo.  4. Use CHG as you would any other liquid soap. You can apply CHG directly to the skin and wash gently with a scrungie or a clean washcloth.   5. Apply the CHG Soap to your body ONLY FROM THE NECK DOWN.  Do not use on open wounds or open sores. Avoid contact with your eyes, ears, mouth and genitals (private parts). Wash Face and genitals (private parts)  with your normal soap.  6. Wash thoroughly, paying special attention to the  area where your surgery will be performed.  7. Thoroughly rinse your body with warm water from the neck down.  8. DO NOT shower/wash with your normal soap after using and rinsing off the CHG Soap.  9. Pat yourself dry with a CLEAN TOWEL.  10. Wear CLEAN PAJAMAS to bed the night before surgery, wear comfortable clothes the morning of surgery  11. Place CLEAN SHEETS on your bed the night of your first shower and DO NOT SLEEP WITH PETS.    Day of Surgery:  Do not apply any deodorants/lotions.  Please wear clean clothes to the hospital/surgery center.   Remember to brush your teeth WITH  YOUR REGULAR TOOTHPASTE.    Please read over the following fact sheets that you were given.

## 2018-09-13 ENCOUNTER — Encounter (HOSPITAL_COMMUNITY)
Admission: RE | Admit: 2018-09-13 | Discharge: 2018-09-13 | Disposition: A | Discharge status: Home / Self Care | Payer: Medicare HMO | Source: Ambulatory Visit | Attending: Neurosurgery | Admitting: Neurosurgery

## 2018-09-13 ENCOUNTER — Encounter (HOSPITAL_COMMUNITY): Payer: Self-pay

## 2018-09-13 ENCOUNTER — Other Ambulatory Visit: Payer: Self-pay

## 2018-09-13 DIAGNOSIS — Z01818 Encounter for other preprocedural examination: Secondary | ICD-10-CM | POA: Diagnosis not present

## 2018-09-13 DIAGNOSIS — I341 Nonrheumatic mitral (valve) prolapse: Secondary | ICD-10-CM | POA: Insufficient documentation

## 2018-09-13 DIAGNOSIS — R9431 Abnormal electrocardiogram [ECG] [EKG]: Secondary | ICD-10-CM | POA: Insufficient documentation

## 2018-09-13 HISTORY — DX: Personal history of urinary calculi: Z87.442

## 2018-09-13 HISTORY — DX: Other specified postprocedural states: Z98.890

## 2018-09-13 HISTORY — DX: Other specified postprocedural states: R11.2

## 2018-09-13 LAB — CBC
HCT: 45.4 % (ref 36.0–46.0)
Hemoglobin: 14.8 g/dL (ref 12.0–15.0)
MCH: 29.8 pg (ref 26.0–34.0)
MCHC: 32.6 g/dL (ref 30.0–36.0)
MCV: 91.5 fL (ref 80.0–100.0)
NRBC: 0 % (ref 0.0–0.2)
Platelets: 310 10*3/uL (ref 150–400)
RBC: 4.96 MIL/uL (ref 3.87–5.11)
RDW: 12.7 % (ref 11.5–15.5)
WBC: 6.1 10*3/uL (ref 4.0–10.5)

## 2018-09-13 LAB — BASIC METABOLIC PANEL
Anion gap: 9 (ref 5–15)
BUN: 14 mg/dL (ref 8–23)
CO2: 22 mmol/L (ref 22–32)
Calcium: 9.5 mg/dL (ref 8.9–10.3)
Chloride: 107 mmol/L (ref 98–111)
Creatinine, Ser: 0.86 mg/dL (ref 0.44–1.00)
GFR calc non Af Amer: >60 mL/min (ref >60)
Glucose, Bld: 112 mg/dL — ABNORMAL HIGH (ref 70–99)
Potassium: 3.9 mmol/L (ref 3.5–5.1)
SODIUM: 138 mmol/L (ref 135–145)

## 2018-09-13 LAB — SURGICAL PCR SCREEN
MRSA, PCR: NEGATIVE
Staphylococcus aureus: NEGATIVE

## 2018-09-13 LAB — TYPE AND SCREEN
ABO/RH(D): A POS
Antibody Screen: NEGATIVE

## 2018-09-13 LAB — ABO/RH: ABO/RH(D): A POS

## 2018-09-13 NOTE — Progress Notes (Signed)
PCP - Dorothyann Peng Cardiologist - denies  Chest x-ray - not needed EKG - 09/13/17  Stress Test -denies  ECHO - denies Cardiac Cath - denies  Aspirin Instructions: stop 5 days  Anesthesia review: yes, mitral valve prolapse  Patient denies shortness of breath, fever, cough and chest pain at PAT appointment   Patient verbalized understanding of instructions that were given to them at the PAT appointment. Patient was also instructed that they will need to review over the PAT instructions again at home before surgery.

## 2018-09-16 ENCOUNTER — Encounter (HOSPITAL_COMMUNITY): Payer: Self-pay

## 2018-09-19 ENCOUNTER — Inpatient Hospital Stay (HOSPITAL_COMMUNITY)
Admission: RE | Admit: 2018-09-19 | Discharge: 2018-09-20 | DRG: 455 | Disposition: A | Discharge status: Home / Self Care | Payer: Medicare HMO | Attending: Neurosurgery | Admitting: Neurosurgery

## 2018-09-19 ENCOUNTER — Encounter (HOSPITAL_COMMUNITY)
Admission: RE | Disposition: A | Discharge status: Home / Self Care | Payer: Self-pay | Source: Home / Self Care | Attending: Neurosurgery

## 2018-09-19 ENCOUNTER — Other Ambulatory Visit: Payer: Self-pay

## 2018-09-19 ENCOUNTER — Inpatient Hospital Stay (HOSPITAL_COMMUNITY): Payer: Medicare HMO

## 2018-09-19 ENCOUNTER — Inpatient Hospital Stay (HOSPITAL_COMMUNITY): Payer: Medicare HMO | Admitting: Physician Assistant

## 2018-09-19 ENCOUNTER — Encounter (HOSPITAL_COMMUNITY): Payer: Self-pay

## 2018-09-19 ENCOUNTER — Inpatient Hospital Stay (HOSPITAL_COMMUNITY): Payer: Medicare HMO | Admitting: Anesthesiology

## 2018-09-19 DIAGNOSIS — M4316 Spondylolisthesis, lumbar region: Secondary | ICD-10-CM | POA: Diagnosis not present

## 2018-09-19 DIAGNOSIS — M48062 Spinal stenosis, lumbar region with neurogenic claudication: Secondary | ICD-10-CM | POA: Diagnosis not present

## 2018-09-19 DIAGNOSIS — M5136 Other intervertebral disc degeneration, lumbar region: Secondary | ICD-10-CM | POA: Diagnosis present

## 2018-09-19 DIAGNOSIS — Z87442 Personal history of urinary calculi: Secondary | ICD-10-CM | POA: Diagnosis not present

## 2018-09-19 DIAGNOSIS — Z981 Arthrodesis status: Secondary | ICD-10-CM | POA: Diagnosis not present

## 2018-09-19 DIAGNOSIS — Z882 Allergy status to sulfonamides status: Secondary | ICD-10-CM | POA: Diagnosis not present

## 2018-09-19 DIAGNOSIS — Z888 Allergy status to other drugs, medicaments and biological substances status: Secondary | ICD-10-CM

## 2018-09-19 DIAGNOSIS — E785 Hyperlipidemia, unspecified: Secondary | ICD-10-CM | POA: Diagnosis present

## 2018-09-19 DIAGNOSIS — M5127 Other intervertebral disc displacement, lumbosacral region: Secondary | ICD-10-CM | POA: Diagnosis present

## 2018-09-19 DIAGNOSIS — E039 Hypothyroidism, unspecified: Secondary | ICD-10-CM | POA: Diagnosis present

## 2018-09-19 DIAGNOSIS — M5126 Other intervertebral disc displacement, lumbar region: Secondary | ICD-10-CM | POA: Diagnosis present

## 2018-09-19 DIAGNOSIS — M47816 Spondylosis without myelopathy or radiculopathy, lumbar region: Secondary | ICD-10-CM | POA: Diagnosis not present

## 2018-09-19 DIAGNOSIS — Z419 Encounter for procedure for purposes other than remedying health state, unspecified: Secondary | ICD-10-CM

## 2018-09-19 SURGERY — POSTERIOR LUMBAR FUSION 1 LEVEL
Anesthesia: General | Site: Back

## 2018-09-19 MED ORDER — BUPIVACAINE HCL (PF) 0.5 % IJ SOLN
INTRAMUSCULAR | Status: DC | PRN
  Administered 2018-09-19: 19 mL

## 2018-09-19 MED ORDER — CHLORHEXIDINE GLUCONATE CLOTH 2 % EX PADS: 6.0000 | MEDICATED_PAD | Freq: Once | CUTANEOUS | Status: DC

## 2018-09-19 MED ORDER — THROMBIN 20000 UNITS EX SOLR
CUTANEOUS | Status: AC
  Filled 2018-09-19: qty 20000

## 2018-09-19 MED ORDER — OXYCODONE HCL 5 MG PO TABS
ORAL_TABLET | ORAL | Status: AC
  Filled 2018-09-19: qty 1

## 2018-09-19 MED ORDER — MORPHINE SULFATE (PF) 4 MG/ML IV SOLN: 4.0000 mg | INTRAVENOUS | Status: DC | PRN

## 2018-09-19 MED ORDER — LIDOCAINE-EPINEPHRINE 1 %-1:100000 IJ SOLN
INTRAMUSCULAR | Status: DC | PRN
  Administered 2018-09-19: 19 mL

## 2018-09-19 MED ORDER — SUGAMMADEX SODIUM 200 MG/2ML IV SOLN
INTRAVENOUS | Status: DC | PRN
  Administered 2018-09-19: 200 mg via INTRAVENOUS

## 2018-09-19 MED ORDER — KETOROLAC TROMETHAMINE 0.5 % OP SOLN
1.0000 [drp] | Freq: Four times a day (QID) | OPHTHALMIC | Status: DC
  Administered 2018-09-19 (×2): 1 [drp] via OPHTHALMIC
  Filled 2018-09-19: qty 5

## 2018-09-19 MED ORDER — THROMBIN 20000 UNITS EX SOLR
CUTANEOUS | Status: DC | PRN
  Administered 2018-09-19: 14:00:00 via TOPICAL

## 2018-09-19 MED ORDER — MENTHOL 3 MG MT LOZG: 1.0000 | LOZENGE | OROMUCOSAL | Status: DC | PRN

## 2018-09-19 MED ORDER — CEFAZOLIN SODIUM-DEXTROSE 2-4 GM/100ML-% IV SOLN
2.0000 g | INTRAVENOUS | Status: AC
  Administered 2018-09-19: 2 g via INTRAVENOUS

## 2018-09-19 MED ORDER — PHENOL 1.4 % MT LIQD: 1.0000 | OROMUCOSAL | Status: DC | PRN

## 2018-09-19 MED ORDER — SCOPOLAMINE 1 MG/3DAYS TD PT72
MEDICATED_PATCH | TRANSDERMAL | Status: AC
  Filled 2018-09-19: qty 1

## 2018-09-19 MED ORDER — KETOROLAC TROMETHAMINE 30 MG/ML IJ SOLN
15.0000 mg | Freq: Once | INTRAMUSCULAR | Status: AC
  Administered 2018-09-19: 15 mg via INTRAVENOUS

## 2018-09-19 MED ORDER — ACETAMINOPHEN 650 MG RE SUPP: 650.0000 mg | RECTAL | Status: DC | PRN

## 2018-09-19 MED ORDER — ONDANSETRON HCL 4 MG/2ML IJ SOLN
INTRAMUSCULAR | Status: AC
  Filled 2018-09-19: qty 2

## 2018-09-19 MED ORDER — 0.9 % SODIUM CHLORIDE (POUR BTL) OPTIME
TOPICAL | Status: DC | PRN
  Administered 2018-09-19: 1000 mL

## 2018-09-19 MED ORDER — BUPIVACAINE HCL (PF) 0.5 % IJ SOLN
INTRAMUSCULAR | Status: AC
  Filled 2018-09-19: qty 30

## 2018-09-19 MED ORDER — MAGNESIUM HYDROXIDE 400 MG/5ML PO SUSP: 30.0000 mL | Freq: Every day | ORAL | Status: DC | PRN

## 2018-09-19 MED ORDER — ACETAMINOPHEN 10 MG/ML IV SOLN
INTRAVENOUS | Status: AC
  Filled 2018-09-19: qty 100

## 2018-09-19 MED ORDER — ROCURONIUM BROMIDE 50 MG/5ML IV SOSY
PREFILLED_SYRINGE | INTRAVENOUS | Status: DC | PRN
  Administered 2018-09-19 (×2): 50 mg via INTRAVENOUS

## 2018-09-19 MED ORDER — ONDANSETRON HCL 4 MG/2ML IJ SOLN: 4.0000 mg | Freq: Once | INTRAMUSCULAR | Status: DC | PRN

## 2018-09-19 MED ORDER — PROPOFOL 10 MG/ML IV BOLUS
INTRAVENOUS | Status: DC | PRN
  Administered 2018-09-19: 150 mg via INTRAVENOUS

## 2018-09-19 MED ORDER — ACETAMINOPHEN 325 MG PO TABS: 650.0000 mg | ORAL_TABLET | ORAL | Status: DC | PRN

## 2018-09-19 MED ORDER — DEXAMETHASONE SODIUM PHOSPHATE 10 MG/ML IJ SOLN
INTRAMUSCULAR | Status: AC
  Filled 2018-09-19: qty 1

## 2018-09-19 MED ORDER — ALBUMIN HUMAN 5 % IV SOLN
INTRAVENOUS | Status: AC
  Filled 2018-09-19: qty 250

## 2018-09-19 MED ORDER — ROCURONIUM BROMIDE 50 MG/5ML IV SOSY
PREFILLED_SYRINGE | INTRAVENOUS | Status: AC
  Filled 2018-09-19: qty 5

## 2018-09-19 MED ORDER — SODIUM CHLORIDE 0.9 % IV SOLN
INTRAVENOUS | Status: DC | PRN
  Administered 2018-09-19: 25 ug/min via INTRAVENOUS

## 2018-09-19 MED ORDER — HYDROXYZINE HCL 50 MG/ML IM SOLN: 50.0000 mg | INTRAMUSCULAR | Status: DC | PRN

## 2018-09-19 MED ORDER — KETOROLAC TROMETHAMINE 30 MG/ML IJ SOLN
INTRAMUSCULAR | Status: AC
  Administered 2018-09-19: 15 mg via INTRAVENOUS
  Filled 2018-09-19: qty 1

## 2018-09-19 MED ORDER — THROMBIN 5000 UNITS EX SOLR
OROMUCOSAL | Status: DC | PRN
  Administered 2018-09-19: 14:00:00 via TOPICAL

## 2018-09-19 MED ORDER — HYDROCODONE-ACETAMINOPHEN 5-325 MG PO TABS
1.0000 | ORAL_TABLET | ORAL | Status: DC | PRN
  Administered 2018-09-19 – 2018-09-20 (×3): 1 via ORAL
  Filled 2018-09-19: qty 2
  Filled 2018-09-19 (×3): qty 1

## 2018-09-19 MED ORDER — PROPOFOL 500 MG/50ML IV EMUL
INTRAVENOUS | Status: DC | PRN
  Administered 2018-09-19: 25 ug/kg/min via INTRAVENOUS

## 2018-09-19 MED ORDER — HYDROXYZINE HCL 25 MG PO TABS: 50.0000 mg | ORAL_TABLET | ORAL | Status: DC | PRN

## 2018-09-19 MED ORDER — KETOROLAC TROMETHAMINE 30 MG/ML IJ SOLN
15.0000 mg | Freq: Four times a day (QID) | INTRAMUSCULAR | Status: DC
  Administered 2018-09-19 – 2018-09-20 (×3): 15 mg via INTRAVENOUS
  Filled 2018-09-19 (×3): qty 1

## 2018-09-19 MED ORDER — SODIUM CHLORIDE 0.9 % IV SOLN
INTRAVENOUS | Status: DC | PRN
  Administered 2018-09-19: 14:00:00

## 2018-09-19 MED ORDER — VITAMIN D 25 MCG (1000 UNIT) PO TABS: 2000.0000 [IU] | ORAL_TABLET | Freq: Every day | ORAL | Status: DC

## 2018-09-19 MED ORDER — THROMBIN 5000 UNITS EX SOLR
CUTANEOUS | Status: AC
  Filled 2018-09-19: qty 5000

## 2018-09-19 MED ORDER — PROPOFOL 10 MG/ML IV BOLUS
INTRAVENOUS | Status: AC
  Filled 2018-09-19: qty 20

## 2018-09-19 MED ORDER — CEFAZOLIN SODIUM-DEXTROSE 2-4 GM/100ML-% IV SOLN
INTRAVENOUS | Status: AC
  Filled 2018-09-19: qty 100

## 2018-09-19 MED ORDER — PHENYLEPHRINE 40 MCG/ML (10ML) SYRINGE FOR IV PUSH (FOR BLOOD PRESSURE SUPPORT)
PREFILLED_SYRINGE | INTRAVENOUS | Status: DC | PRN
  Administered 2018-09-19: 80 ug via INTRAVENOUS
  Administered 2018-09-19: 40 ug via INTRAVENOUS
  Administered 2018-09-19 (×2): 80 ug via INTRAVENOUS

## 2018-09-19 MED ORDER — ONDANSETRON HCL 4 MG/2ML IJ SOLN
INTRAMUSCULAR | Status: DC | PRN
  Administered 2018-09-19: 4 mg via INTRAVENOUS

## 2018-09-19 MED ORDER — LACTATED RINGERS IV SOLN
INTRAVENOUS | Status: DC
  Administered 2018-09-19 (×2): via INTRAVENOUS

## 2018-09-19 MED ORDER — ACETAMINOPHEN 10 MG/ML IV SOLN
1000.0000 mg | Freq: Once | INTRAVENOUS | Status: AC
  Administered 2018-09-19: 1000 mg via INTRAVENOUS

## 2018-09-19 MED ORDER — FLEET ENEMA 7-19 GM/118ML RE ENEM: 1.0000 | ENEMA | Freq: Once | RECTAL | Status: DC | PRN

## 2018-09-19 MED ORDER — BISACODYL 10 MG RE SUPP: 10.0000 mg | Freq: Every day | RECTAL | Status: DC | PRN

## 2018-09-19 MED ORDER — FENTANYL CITRATE (PF) 100 MCG/2ML IJ SOLN
INTRAMUSCULAR | Status: DC | PRN
  Administered 2018-09-19 (×3): 50 ug via INTRAVENOUS
  Administered 2018-09-19: 100 ug via INTRAVENOUS

## 2018-09-19 MED ORDER — FENTANYL CITRATE (PF) 250 MCG/5ML IJ SOLN
INTRAMUSCULAR | Status: AC
  Filled 2018-09-19: qty 5

## 2018-09-19 MED ORDER — FENTANYL CITRATE (PF) 100 MCG/2ML IJ SOLN
INTRAMUSCULAR | Status: AC
  Filled 2018-09-19: qty 2

## 2018-09-19 MED ORDER — OXYCODONE HCL 5 MG PO TABS
5.0000 mg | ORAL_TABLET | Freq: Once | ORAL | Status: AC | PRN
  Administered 2018-09-19: 5 mg via ORAL

## 2018-09-19 MED ORDER — FENTANYL CITRATE (PF) 100 MCG/2ML IJ SOLN
25.0000 ug | INTRAMUSCULAR | Status: DC | PRN
  Administered 2018-09-19: 50 ug via INTRAVENOUS

## 2018-09-19 MED ORDER — LEVOTHYROXINE SODIUM 112 MCG PO TABS
112.0000 ug | ORAL_TABLET | Freq: Every day | ORAL | Status: DC
  Administered 2018-09-20: 112 ug via ORAL
  Filled 2018-09-19: qty 1

## 2018-09-19 MED ORDER — DEXAMETHASONE SODIUM PHOSPHATE 10 MG/ML IJ SOLN
INTRAMUSCULAR | Status: DC | PRN
  Administered 2018-09-19: 10 mg via INTRAVENOUS

## 2018-09-19 MED ORDER — MIDAZOLAM HCL 2 MG/2ML IJ SOLN
INTRAMUSCULAR | Status: AC
  Filled 2018-09-19: qty 2

## 2018-09-19 MED ORDER — CYCLOBENZAPRINE HCL 5 MG PO TABS
5.0000 mg | ORAL_TABLET | Freq: Three times a day (TID) | ORAL | Status: DC | PRN
  Filled 2018-09-19: qty 2

## 2018-09-19 MED ORDER — ATORVASTATIN CALCIUM 10 MG PO TABS
20.0000 mg | ORAL_TABLET | Freq: Every day | ORAL | Status: DC
  Administered 2018-09-19: 20 mg via ORAL
  Filled 2018-09-19: qty 2

## 2018-09-19 MED ORDER — LUTEIN 20 MG PO CAPS: 20.0000 mg | ORAL_CAPSULE | Freq: Every day | ORAL | Status: DC

## 2018-09-19 MED ORDER — MIDAZOLAM HCL 5 MG/5ML IJ SOLN
INTRAMUSCULAR | Status: DC | PRN
  Administered 2018-09-19: 2 mg via INTRAVENOUS

## 2018-09-19 MED ORDER — SODIUM CHLORIDE 0.9% FLUSH: 3.0000 mL | INTRAVENOUS | Status: DC | PRN

## 2018-09-19 MED ORDER — SODIUM CHLORIDE 0.9% FLUSH
3.0000 mL | Freq: Two times a day (BID) | INTRAVENOUS | Status: DC
  Administered 2018-09-19: 3 mL via INTRAVENOUS

## 2018-09-19 MED ORDER — LIDOCAINE-EPINEPHRINE 1 %-1:100000 IJ SOLN
INTRAMUSCULAR | Status: AC
  Filled 2018-09-19: qty 1

## 2018-09-19 MED ORDER — DICYCLOMINE HCL 10 MG PO CAPS: 10.0000 mg | ORAL_CAPSULE | Freq: Every day | ORAL | Status: DC | PRN

## 2018-09-19 MED ORDER — LIDOCAINE 2% (20 MG/ML) 5 ML SYRINGE
INTRAMUSCULAR | Status: AC
  Filled 2018-09-19: qty 5

## 2018-09-19 MED ORDER — LIDOCAINE 2% (20 MG/ML) 5 ML SYRINGE
INTRAMUSCULAR | Status: DC | PRN
  Administered 2018-09-19: 60 mg via INTRAVENOUS

## 2018-09-19 MED ORDER — ALBUMIN HUMAN 5 % IV SOLN
12.5000 g | Freq: Once | INTRAVENOUS | Status: AC
  Administered 2018-09-19: 12.5 g via INTRAVENOUS

## 2018-09-19 MED ORDER — OXYCODONE HCL 5 MG/5ML PO SOLN: 5.0000 mg | Freq: Once | ORAL | Status: AC | PRN

## 2018-09-19 MED ORDER — SODIUM CHLORIDE 0.9 % IV SOLN: 250.0000 mL | INTRAVENOUS | Status: DC

## 2018-09-19 MED ORDER — KETOROLAC TROMETHAMINE 0.5 % OP SOLN
1.0000 [drp] | Freq: Three times a day (TID) | OPHTHALMIC | Status: DC | PRN
  Filled 2018-09-19: qty 5

## 2018-09-19 MED ORDER — ALUM & MAG HYDROXIDE-SIMETH 200-200-20 MG/5ML PO SUSP: 30.0000 mL | Freq: Four times a day (QID) | ORAL | Status: DC | PRN

## 2018-09-19 MED ORDER — KCL IN DEXTROSE-NACL 20-5-0.45 MEQ/L-%-% IV SOLN: INTRAVENOUS | Status: DC

## 2018-09-19 MED ORDER — SCOPOLAMINE 1 MG/3DAYS TD PT72
1.0000 | MEDICATED_PATCH | Freq: Once | TRANSDERMAL | Status: DC
  Administered 2018-09-19: 1.5 mg via TRANSDERMAL

## 2018-09-19 SURGICAL SUPPLY — 76 items
BAG DECANTER FOR FLEXI CONT (MISCELLANEOUS) ×2 IMPLANT
BENZOIN TINCTURE PRP APPL 2/3 (GAUZE/BANDAGES/DRESSINGS) ×2 IMPLANT
BLADE CLIPPER SURG (BLADE) IMPLANT
BUR ACRON 5.0MM COATED (BURR) ×2 IMPLANT
BUR MATCHSTICK NEURO 3.0 LAGG (BURR) ×2 IMPLANT
CAGE POST LUM 11X23X9 6D (Cage) ×2 IMPLANT
CANISTER SUCT 3000ML PPV (MISCELLANEOUS) ×1 IMPLANT
CAP LCK SPNE (Orthopedic Implant) ×4 IMPLANT
CAP LOCK SPINE RADIUS (Orthopedic Implant) IMPLANT
CAP LOCKING (Orthopedic Implant) ×4 IMPLANT
CARTRIDGE OIL MAESTRO DRILL (MISCELLANEOUS) ×1 IMPLANT
CONT SPEC 4OZ CLIKSEAL STRL BL (MISCELLANEOUS) ×2 IMPLANT
COVER BACK TABLE 60X90IN (DRAPES) ×2 IMPLANT
COVER WAND RF STERILE (DRAPES) ×1 IMPLANT
DECANTER SPIKE VIAL GLASS SM (MISCELLANEOUS) ×2 IMPLANT
DERMABOND ADVANCED (GAUZE/BANDAGES/DRESSINGS) ×1
DERMABOND ADVANCED .7 DNX12 (GAUZE/BANDAGES/DRESSINGS) ×1 IMPLANT
DIFFUSER DRILL AIR PNEUMATIC (MISCELLANEOUS) ×2 IMPLANT
DRAPE C-ARM 42X72 X-RAY (DRAPES) ×3 IMPLANT
DRAPE C-ARMOR (DRAPES) ×1 IMPLANT
DRAPE HALF SHEET 40X57 (DRAPES) ×2 IMPLANT
DRAPE LAPAROTOMY 100X72X124 (DRAPES) ×2 IMPLANT
DRAPE POUCH INSTRU U-SHP 10X18 (DRAPES) ×1 IMPLANT
ELECT REM PT RETURN 9FT ADLT (ELECTROSURGICAL) ×2
ELECTRODE REM PT RTRN 9FT ADLT (ELECTROSURGICAL) ×1 IMPLANT
GAUZE 4X4 16PLY RFD (DISPOSABLE) IMPLANT
GAUZE SPONGE 4X4 12PLY STRL (GAUZE/BANDAGES/DRESSINGS) ×2 IMPLANT
GLOVE BIO SURGEON STRL SZ 6.5 (GLOVE) ×6 IMPLANT
GLOVE BIO SURGEON STRL SZ8 (GLOVE) ×1 IMPLANT
GLOVE BIO SURGEON STRL SZ8.5 (GLOVE) ×1 IMPLANT
GLOVE BIOGEL PI IND STRL 6.5 (GLOVE) IMPLANT
GLOVE BIOGEL PI IND STRL 8 (GLOVE) ×2 IMPLANT
GLOVE BIOGEL PI INDICATOR 6.5 (GLOVE) ×4
GLOVE BIOGEL PI INDICATOR 8 (GLOVE) ×2
GLOVE ECLIPSE 7.5 STRL STRAW (GLOVE) ×6 IMPLANT
GLOVE SURG SS PI 6.0 STRL IVOR (GLOVE) ×2 IMPLANT
GOWN STRL REUS W/ TWL LRG LVL3 (GOWN DISPOSABLE) IMPLANT
GOWN STRL REUS W/ TWL XL LVL3 (GOWN DISPOSABLE) ×2 IMPLANT
GOWN STRL REUS W/TWL 2XL LVL3 (GOWN DISPOSABLE) IMPLANT
GOWN STRL REUS W/TWL LRG LVL3 (GOWN DISPOSABLE) ×4
GOWN STRL REUS W/TWL XL LVL3 (GOWN DISPOSABLE) ×3
HEMOSTAT POWDER KIT SURGIFOAM (HEMOSTASIS) ×1 IMPLANT
KIT BASIN OR (CUSTOM PROCEDURE TRAY) ×2 IMPLANT
KIT INFUSE X SMALL 1.4CC (Orthopedic Implant) ×1 IMPLANT
KIT TURNOVER KIT B (KITS) ×2 IMPLANT
NDL ASP BONE MRW 8GX15 (NEEDLE) IMPLANT
NDL SPNL 18GX3.5 QUINCKE PK (NEEDLE) ×1 IMPLANT
NDL SPNL 22GX3.5 QUINCKE BK (NEEDLE) ×1 IMPLANT
NEEDLE ASP BONE MRW 8GX15 (NEEDLE) ×2 IMPLANT
NEEDLE SPNL 18GX3.5 QUINCKE PK (NEEDLE) ×4 IMPLANT
NEEDLE SPNL 22GX3.5 QUINCKE BK (NEEDLE) ×2 IMPLANT
NS IRRIG 1000ML POUR BTL (IV SOLUTION) ×2 IMPLANT
OIL CARTRIDGE MAESTRO DRILL (MISCELLANEOUS) ×2
PACK LAMINECTOMY NEURO (CUSTOM PROCEDURE TRAY) ×2 IMPLANT
PAD ARMBOARD 7.5X6 YLW CONV (MISCELLANEOUS) ×6 IMPLANT
PATTIES SURGICAL .5 X.5 (GAUZE/BANDAGES/DRESSINGS) IMPLANT
PATTIES SURGICAL .5 X1 (DISPOSABLE) IMPLANT
PATTIES SURGICAL 1X1 (DISPOSABLE) ×1 IMPLANT
ROD RADIUS 35MM (Rod) ×2 IMPLANT
SCREW 5.75X40M (Screw) ×4 IMPLANT
SPONGE LAP 4X18 RFD (DISPOSABLE) IMPLANT
SPONGE NEURO XRAY DETECT 1X3 (DISPOSABLE) IMPLANT
SPONGE SURGIFOAM ABS GEL 100 (HEMOSTASIS) ×2 IMPLANT
STRIP BIOACTIVE VITOSS 25X100X (Neuro Prosthesis/Implant) ×1 IMPLANT
STRIP BIOACTIVE VITOSS 25X52X4 (Orthopedic Implant) ×1 IMPLANT
SUT VIC AB 1 CT1 18XBRD ANBCTR (SUTURE) ×2 IMPLANT
SUT VIC AB 1 CT1 8-18 (SUTURE) ×2
SUT VIC AB 2-0 CP2 18 (SUTURE) ×4 IMPLANT
SYR 3ML LL SCALE MARK (SYRINGE) IMPLANT
SYR CONTROL 10ML LL (SYRINGE) ×2 IMPLANT
TAPE CLOTH SURG 4X10 WHT LF (GAUZE/BANDAGES/DRESSINGS) ×1 IMPLANT
TOWEL GREEN STERILE (TOWEL DISPOSABLE) ×2 IMPLANT
TOWEL GREEN STERILE FF (TOWEL DISPOSABLE) ×2 IMPLANT
TRAY FOLEY BAG SILVER LF 16FR (CATHETERS) ×1 IMPLANT
TRAY FOLEY MTR SLVR 16FR STAT (SET/KITS/TRAYS/PACK) ×1 IMPLANT
WATER STERILE IRR 1000ML POUR (IV SOLUTION) ×2 IMPLANT

## 2018-09-19 NOTE — Transfer of Care (Signed)
Immediate Anesthesia Transfer of Care Note  Patient: Angela Houston  Procedure(s) Performed: LUMBAR FOUR- LUMBAR FIVE DECOMPRESSION, POSTERIOR LUMBAR INTERBODY FUSION AND POSTERIOR LATERAL ARTHRODESIS (N/A Back)  Patient Location: PACU  Anesthesia Type:General  Level of Consciousness: awake, alert  and oriented  Airway & Oxygen Therapy: Patient Spontanous Breathing and Patient connected to nasal cannula oxygen  Post-op Assessment: Report given to RN, Post -op Vital signs reviewed and stable and Patient moving all extremities X 4  Post vital signs: Reviewed and stable  Last Vitals:  Vitals Value Taken Time  BP 98/57 09/19/2018  3:42 PM  Temp    Pulse 71 09/19/2018  3:42 PM  Resp 16 09/19/2018  3:42 PM  SpO2 95 % 09/19/2018  3:42 PM  Vitals shown include unvalidated device data.  Last Pain:  Vitals:   09/19/18 0855  TempSrc:   PainSc: 2       Patients Stated Pain Goal: 2 (34/74/25 9563)  Complications: No apparent anesthesia complications

## 2018-09-19 NOTE — H&P (Signed)
Subjective: Patient is a 68 y.o. right-handed white female who is admitted for treatment of marked multifactorial lumbar stenosis at the L4-5 level, contributed to by a grade 1 dynamic degenerative spondylolisthesis of L4 on L5, and a left L4-5 lumbar disc condition extended rostrally behind the body of L4.  Patient is 7 months status post a left L5-S1 lumbar laminotomy and microdiscectomy for left L5-S1 lumbar disc herniation.  MRI in the past month shows successful microdiscectomy at L5-S1 with no residual or recurrent disc herniation.  There are more mild degenerative changes seen at the L2-3 and L3-4 levels.  Symptomatically been having acute left-sided low back pain and pain extending into the buttocks and posterior thighs bilaterally.  She is limited in the distance that she can walk, consistent with neurogenic claudication.  She has been treated with steroid Dosepak, NSAIDs, and muscle relaxants without relief.  She is admitted now for an L4-5 lumbar decompression and stabilization including bilateral L4-5 lumbar laminectomy, facetectomy, foraminotomies, and discectomy, and bilateral L4-5 posterior lumbar interbody arthrodesis with interbody implants and bone graft and bilateral L4-5 posterior lateral arthrodesis with posterior instrumentation and bone graft.   Patient Active Problem List   Diagnosis Date Noted  . Routine general medical examination at a health care facility 07/08/2014  . Mitral valve prolapse 11/24/2010  . Hyperlipidemia 11/24/2010  . Hypothyroidism 11/24/2010  . ALLERGIC RHINITIS 07/26/2009   Past Medical History:  Diagnosis Date  . Diverticulitis   . History of kidney stones   . Hyperlipidemia   . Hypothyroidism   . Kidney stones   . Mitral valve prolapse   . Ovarian cyst   . PONV (postoperative nausea and vomiting)   . Thyroid disease     Past Surgical History:  Procedure Laterality Date  . APPENDECTOMY    . BREAST EXCISIONAL BIOPSY Right   . COLONOSCOPY   2018  . OVARIAN CYST REMOVAL    . TONSILLECTOMY    . TUBAL LIGATION      No medications prior to admission.   Allergies  Allergen Reactions  . Tizanidine Hcl Anaphylaxis  . Sulfamethoxazole-Trimethoprim Rash and Other (See Comments)    fever,headache,chills    Social History   Tobacco Use  . Smoking status: Never Smoker  . Smokeless tobacco: Never Used  Substance Use Topics  . Alcohol use: No    Family History  Problem Relation Age of Onset  . COPD Father        smoker  . Hyperlipidemia Father   . Glaucoma Father   . Heart disease Father   . Breast cancer Sister   . Colon cancer Neg Hx      Review of Systems Pertinent items noted in HPI and remainder of comprehensive ROS otherwise negative.   EXAM: Patient is a well-developed well-nourished white female in discomfort, but no acute distress.   Lungs are clear to auscultation , the patient has symmetrical respiratory excursion. Heart has a regular rate and rhythm normal S1 and S2 no murmur.   Abdomen is soft nontender nondistended bowel sounds are present. Extremity examination shows no clubbing cyanosis or edema. Motor examination shows 5 over 5 strength in the lower extremities including the iliopsoas quadriceps dorsiflexor extensor hallicus  longus and plantar flexor bilaterally. Sensation is intact to pinprick in the distal lower extremities. Reflex examination shows a lessened left gastrocnemius reflex.  No pathologic reflexes are present. Patient has a normal gait and stance.   Data Review:CBC    Component Value Date/Time  WBC 6.1 09/13/2018 0912   RBC 4.96 09/13/2018 0912   HGB 14.8 09/13/2018 0912   HCT 45.4 09/13/2018 0912   PLT 310 09/13/2018 0912   MCV 91.5 09/13/2018 0912   MCH 29.8 09/13/2018 0912   MCHC 32.6 09/13/2018 0912   RDW 12.7 09/13/2018 0912   LYMPHSABS 2.1 10/04/2017 1614   MONOABS 0.5 10/04/2017 1614   EOSABS 0.2 10/04/2017 1614   BASOSABS 0.1 10/04/2017 1614                           BMET    Component Value Date/Time   NA 138 09/13/2018 0912   K 3.9 09/13/2018 0912   CL 107 09/13/2018 0912   CO2 22 09/13/2018 0912   GLUCOSE 112 (H) 09/13/2018 0912   BUN 14 09/13/2018 0912   CREATININE 0.86 09/13/2018 0912   CALCIUM 9.5 09/13/2018 0912   GFRNONAA >60 09/13/2018 0912   GFRAA >60 09/13/2018 0912     Assessment/Plan: Patient with left-sided low back pain and pain extending to the lower extremities bilaterally aggravated by walking even short distances consistent with neurogenic claudication, with marked multifactorial lumbar stenosis at L4-5, contributed to by a grade 1 dynamic degenerative spondylolisthesis and a left L4-5 lumbar disc condition with a fragment is migrated rostrally behind the body of L4.  Patient is admitted now for L4-5 lumbar decompression and stabilization.  I've discussed with the patient the nature of his condition, the nature the surgical procedure, the typical length of surgery, hospital stay, and overall recuperation, the limitations postoperatively, and risks of surgery. I discussed risks including risks of infection, bleeding, possibly need for transfusion, the risk of nerve root dysfunction with pain, weakness, numbness, or paresthesias, the risk of dural tear and CSF leakage and possible need for further surgery, the risk of failure of the arthrodesis and possibly for further surgery, the risk of anesthetic complications including myocardial infarction, stroke, pneumonia, and death. We discussed the need for postoperative immobilization in a lumbar brace. Understanding all this the patient does wish to proceed with surgery and is admitted for such.     Hosie Spangle, MD 09/19/2018 7:34 AM

## 2018-09-19 NOTE — Op Note (Signed)
09/19/2018  3:19 PM  PATIENT:  Angela Houston  68 y.o. female  PRE-OPERATIVE DIAGNOSIS: Multifactorial L4-5 lumbar stenosis with neurogenic claudication; left L4-5 lumbar disc patient; grade 1 degenerative L4-5 spondylolisthesis; lumbar spondylosis; lumbar degenerative disc disease  POST-OPERATIVE DIAGNOSIS:  Multifactorial L4-5 lumbar stenosis with neurogenic claudication; left L4-5 lumbar disc patient; grade 1 degenerative L4-5 spondylolisthesis; lumbar spondylosis; lumbar degenerative disc disease  PROCEDURE:  Procedure(s): L4-5 lumbar decompression including bilateral L4-5 lumbar laminectomy, bilateral L4-5 facetectomy, bilateral foraminotomies for the exiting L4 and L5 nerve roots, bilateral L4-5 microdiscectomy, decompression of central canal, lateral recess, neural foraminal stenosis, with decompression beyond that required for interbody arthrodesis; bilateral L4-5 lumbar stabilization including bilateral posterior lumbar interbody arthrodesis with Tritanium interbody implants, Vitoss BA with bone marrow aspirate, and infuse and bilateral posterior lateral arthrodesis with radius posterior instrumentation, Vitoss BA with bone marrow aspirate, and infuse  SURGEON: Jovita Gamma, MD  ASSISTANTS: Newman Pies, MD  ANESTHESIA:   general  EBL:  Total I/O In: 1000 [I.V.:1000] Out: 450 [Urine:200; Blood:250]  BLOOD ADMINISTERED:none  CELL SAVER GIVEN: Cell Saver technician and CRNA felt that there was insufficient blood recovered to return any to the patient.  COUNT:  Correct per nursing staff  DICTATION: Patient is brought to the operating room placed under general endotracheal anesthesia. The patient was turned to prone position the lumbar region was prepped with Betadine soap and solution and draped in a sterile fashion. The midline was infiltrated with local anesthesia with epinephrine. A localizing x-ray was taken and then a midline incision was made carried down through the  subcutaneous tissue, bipolar cautery and electrocautery were used to maintain hemostasis. Dissection was carried down to the lumbar fascia. The fascia was incised bilaterally and the paraspinal muscles were dissected with a spinous process and lamina in a subperiosteal fashion. Another x-ray was taken for localization and the L4-5 level was localized. Dissection was then carried out laterally over the facet complex and the transverse processes of L4 and L5 were exposed and decorticated.   We then proceeded with decompression.  Bilateral L4 and L5 laminectomy was performed using the high-speed drill and Kerrison punches. Dissection was carried out laterally including facetectomy and foraminotomies with decompression of the stenotic compression of the exiting L4 and L5 nerve roots. Once the decompression of the stenotic compression of the thecal sac and exiting nerve roots was completed we proceeded with the posterior lumbar interbody arthrodesis. The annulus was incised bilaterally and the disc space entered.  A large subligamentous disc condition was found on the left side extending rostrally behind the body of L4, and this was removed, decompressing the thecal sac and nerve root.  A thorough intradiscal discectomy was performed using pituitary rongeurs and curettes. Once the discectomy was completed we began to prepare the endplate surfaces removing the cartilaginous endplates surface. We then measured the height of the intervertebral disc space. We selected 10 x 23 x 6 Tritanium interbody implants.  The C-arm fluoroscope was then draped and brought in the field and we identified the pedicle entry points bilaterally at the L4 and L5 levels. Each of the 4 pedicles was probed, we aspirated bone marrow aspirate from the vertebral bodies, this was injected over a 10 cc and a 5 cc strip of Vitoss BA. Then each of the pedicles was examined with the ball probe good bony surfaces were found and no bony cuts were  found. Each of the pedicles was then tapped with a 5.25 mm tap, again examined  with the ball probe good threading was found and no bony cuts were found. We then placed 5.75 by 40 millimeter screws bilaterally at each level.  We then packed the Tritanium interbody implants with Vitoss BA with bone marrow aspirate and infuse, and then placed the first implant and on the right side, carefully retracting the thecal sac and nerve root medially. We then went back to the left side and packed the midline with additional Vitoss BA with bone marrow aspirate and infuse, and then placed a second implant on the left side again retracting the thecal sac and nerve root medially. Additional Vitoss BA with bone marrow aspirate and infuse was packed lateral to the implants.  We then packed the lateral gutter over the transverse processes and intertransverse space with Vitoss BA with bone marrow aspirate and infuse. We then selected 35 mm pre-lordosed rods, they were placed within the screw heads and secured with locking caps once all 4 locking caps were placed final tightening was performed against a counter torque.  The wound had been irrigated multiple times during the procedure with saline solution and bacitracin solution, good hemostasis was established with a combination of bipolar cautery and Gelfoam with thrombin. Once good hemostasis was confirmed we proceeded with closure paraspinal muscles deep fascia and Scarpa's fascia were closed with interrupted undyed 1 Vicryl sutures the subcutaneous and subcuticular closed with interrupted inverted 2-0 undyed Vicryl sutures the skin edges were approximated with Dermabond.  The wound was dressed with sterile gauze and Hypafix.  Following surgery the patient was turned back to the supine position to be reversed from the anesthetic extubated and transferred to the recovery room for further care.  PLAN OF CARE: Admit to inpatient   PATIENT DISPOSITION:  PACU - hemodynamically  stable.   Delay start of Pharmacological VTE agent (>24hrs) due to surgical blood loss or risk of bleeding:  yes

## 2018-09-19 NOTE — Anesthesia Procedure Notes (Signed)
Procedure Name: Intubation Date/Time: 09/19/2018 11:52 AM Performed by: Kyung Rudd, CRNA Pre-anesthesia Checklist: Patient identified, Emergency Drugs available, Suction available and Patient being monitored Patient Re-evaluated:Patient Re-evaluated prior to induction Oxygen Delivery Method: Circle system utilized Preoxygenation: Pre-oxygenation with 100% oxygen Induction Type: IV induction Ventilation: Mask ventilation without difficulty Laryngoscope Size: Mac and 3 Grade View: Grade I Tube type: Oral Tube size: 7.0 mm Number of attempts: 2 Airway Equipment and Method: Stylet Placement Confirmation: ETT inserted through vocal cords under direct vision,  positive ETCO2 and breath sounds checked- equal and bilateral Secured at: 21 cm Tube secured with: Tape Dental Injury: Teeth and Oropharynx as per pre-operative assessment  Comments: DL x1 with MAC 3, Grade I view, unable to pass ETT through vocal cords by CRNA. 2nd DL with MAC 3, grade I view, ETT advanced through vocal cords by MDA.

## 2018-09-19 NOTE — Anesthesia Preprocedure Evaluation (Addendum)
Anesthesia Evaluation  Patient identified by MRN, date of birth, ID band Patient awake    Reviewed: Allergy & Precautions, NPO status , Patient's Chart, lab work & pertinent test results  History of Anesthesia Complications (+) PONV and history of anesthetic complications  Airway Mallampati: II  TM Distance: >3 FB Neck ROM: Full    Dental  (+) Dental Advisory Given, Teeth Intact   Pulmonary neg pulmonary ROS,    breath sounds clear to auscultation       Cardiovascular Exercise Tolerance: Good + Valvular Problems/Murmurs MVP  Rhythm:Regular Rate:Normal     Neuro/Psych negative neurological ROS  negative psych ROS   GI/Hepatic negative GI ROS, Neg liver ROS,   Endo/Other  Hypothyroidism   Renal/GU Renal disease (kidney stones)     Musculoskeletal negative musculoskeletal ROS (+)   Abdominal   Peds  Hematology negative hematology ROS (+)   Anesthesia Other Findings Anaphylaxis to tizanidine   Reproductive/Obstetrics                           Anesthesia Physical Anesthesia Plan  ASA: II  Anesthesia Plan: General   Post-op Pain Management:    Induction: Intravenous  PONV Risk Score and Plan: 4 or greater and Treatment may vary due to age or medical condition, Ondansetron, Dexamethasone, Scopolamine patch - Pre-op and Propofol infusion  Airway Management Planned: Oral ETT  Additional Equipment: None  Intra-op Plan:   Post-operative Plan: Extubation in OR  Informed Consent: I have reviewed the patients History and Physical, chart, labs and discussed the procedure including the risks, benefits and alternatives for the proposed anesthesia with the patient or authorized representative who has indicated his/her understanding and acceptance.   Dental advisory given  Plan Discussed with: CRNA and Anesthesiologist  Anesthesia Plan Comments:        Anesthesia Quick Evaluation

## 2018-09-19 NOTE — Progress Notes (Signed)
Vitals:   09/19/18 1735 09/19/18 1800 09/19/18 1805 09/19/18 1833  BP: (!) 101/58 (!) 105/58 (!) 106/59 (!) 102/57  Pulse: 85 80 82 77  Resp: 17 13 16 20   Temp:    97.9 F (36.6 C)  TempSrc:    Oral  SpO2: 96% 100% 100% 98%  Weight:      Height:        Patient resting in bed, comfortable.  Has not yet ambulated.  Foley to straight drainage.  Dressing clean and dry.  Plan: Patient ambulates well this evening, Foley to be DC'd, and nursing staff to monitor voiding function.  Discussed instructions post discharge with the patient, her husband, and her daughters.  Their questions were answered for them.  We will continue to progress through postoperative recovery.  Hosie Spangle, MD 09/19/2018, 7:44 PM

## 2018-09-20 MED ORDER — HYDROCODONE-ACETAMINOPHEN 5-325 MG PO TABS: 1.0000 | ORAL_TABLET | ORAL | 0 refills | Status: DC | PRN

## 2018-09-20 MED FILL — Sodium Chloride IV Soln 0.9%: INTRAVENOUS | Qty: 1000 | Status: AC

## 2018-09-20 MED FILL — Heparin Sodium (Porcine) Inj 1000 Unit/ML: INTRAMUSCULAR | Qty: 30 | Status: AC

## 2018-09-20 NOTE — Anesthesia Postprocedure Evaluation (Signed)
Anesthesia Post Note  Patient: CANDIE GINTZ  Procedure(s) Performed: LUMBAR FOUR- LUMBAR FIVE DECOMPRESSION, POSTERIOR LUMBAR INTERBODY FUSION AND POSTERIOR LATERAL ARTHRODESIS (N/A Back)     Patient location during evaluation: PACU Anesthesia Type: General Level of consciousness: awake and alert Pain management: pain level controlled Vital Signs Assessment: post-procedure vital signs reviewed and stable Respiratory status: spontaneous breathing, nonlabored ventilation, respiratory function stable and patient connected to nasal cannula oxygen Cardiovascular status: blood pressure returned to baseline and stable Postop Assessment: no apparent nausea or vomiting Anesthetic complications: no    Last Vitals:  Vitals:   09/20/18 0322 09/20/18 0733  BP: (!) 109/56 (!) 90/55  Pulse: 85 73  Resp: 20 18  Temp: 36.8 C 36.7 C  SpO2: 95% 97%    Last Pain:  Vitals:   09/20/18 0733  TempSrc: Oral  PainSc:                  Audry Pili

## 2018-09-20 NOTE — Discharge Summary (Signed)
Physician Discharge Summary  Patient ID: Angela Houston MRN: [id number] DOB/AGE: Mar 07, 1951 68 y.o.  Admit date: 09/19/2018 Discharge date: 09/20/2018  Admission Diagnoses:  Multifactorial L4-5 lumbar stenosis with neurogenic claudication; left L4-5 lumbar disc patient; grade 1 degenerative L4-5 spondylolisthesis; lumbar spondylosis; lumbar degenerative disc disease  Discharge Diagnoses:  Multifactorial L4-5 lumbar stenosis with neurogenic claudication; left L4-5 lumbar disc patient; grade 1 degenerative L4-5 spondylolisthesis; lumbar spondylosis; lumbar degenerative disc disease Active Problems:   Lumbar disc herniation   Discharged Condition: good  Hospital Course: Patient was admitted, underwent an L4-5 lumbar decompression and arthrodesis.  She has had excellent relief of her radicular pain.  She is up and ambulating actively.  Her incision is healing nicely.  She is voiding well.  We are discharging her to home with instructions regarding wound care and activities.  She is scheduled follow-up with me in the office with x-rays in 3 weeks.  Discharge Exam: Blood pressure (!) 90/55, pulse 73, temperature 98.1 F (36.7 C), temperature source Oral, resp. rate 18, height 5\' 7"  (1.702 m), weight 83.5 kg, SpO2 97 %.  Disposition: Discharge disposition: 01-Home or Self Care       Discharge Instructions    Discharge wound care:   Complete by:  As directed    Leave the wound open to air. Shower daily with the wound uncovered. Water and soapy water should run over the incision area. Do not wash directly on the incision for 2 weeks. Remove the glue after 2 weeks.   Driving Restrictions   Complete by:  As directed    No driving for 2 weeks. May ride in the car locally now. May begin to drive locally in 2 weeks.   Other Restrictions   Complete by:  As directed    Walk gradually increasing distances out in the fresh air at least twice a day. Walking additional 6 times inside the house,  gradually increasing distances, daily. No bending, lifting, or twisting. Perform activities between shoulder and waist height (that is at counter height when standing or table height when sitting).     Allergies as of 09/20/2018      Reactions   Tizanidine Hcl Anaphylaxis   Sulfamethoxazole-trimethoprim Rash, Other (See Comments)   fever,headache,chills      Medication List    STOP taking these medications   azithromycin 250 MG tablet Commonly known as:  ZITHROMAX     TAKE these medications   aspirin 81 MG tablet Take 81 mg by mouth daily.   atorvastatin 20 MG tablet Commonly known as:  LIPITOR Take 1 tablet (20 mg total) by mouth daily.   b complex vitamins tablet Take 1 tablet by mouth daily.   CALCIUM 1200 PO Take 1,200 mg by mouth daily.   CO-ENZYME Q-10 PO Take 200 mg by mouth daily.   dicyclomine 10 MG capsule Commonly known as:  BENTYL Take 10 mg by mouth daily as needed for spasms.   Flaxseed Oil 1000 MG Caps Take 1,000 mg by mouth daily.   HYDROcodone-acetaminophen 5-325 MG tablet Commonly known as:  NORCO/VICODIN Take 1-2 tablets by mouth every 4 (four) hours as needed (pain).   hydroxypropyl methylcellulose / hypromellose 2.5 % ophthalmic solution Commonly known as:  ISOPTO TEARS / GONIOVISC Place 1 drop into both eyes 4 (four) times daily as needed for dry eyes.   levothyroxine 112 MCG tablet Commonly known as:  SYNTHROID, LEVOTHROID Take 1 tablet (112 mcg total) by mouth daily.   Lutein 20 MG  Caps Take 20 mg by mouth daily.   Vitamin D 50 MCG (2000 UT) Caps Take 2,000 Units by mouth daily.            Discharge Care Instructions  (From admission, onward)         Start     Ordered   09/20/18 0000  Discharge wound care:    Comments:  Leave the wound open to air. Shower daily with the wound uncovered. Water and soapy water should run over the incision area. Do not wash directly on the incision for 2 weeks. Remove the glue after 2 weeks.    09/20/18 0850           Signed: Hosie Spangle 09/20/2018, 8:50 AM

## 2018-09-20 NOTE — Discharge Instructions (Signed)
Wound Care Leave incision open to air. You may shower. Do not scrub directly on incision.  Do not put any creams, lotions, or ointments on incision. Activity Walk each and every day, increasing distance each day. No lifting greater than 5 lbs.  Avoid bending, arching, and twisting.  Call Your Doctor If Any of These Occur Redness, drainage, or swelling at the wound.  Temperature greater than 101 degrees. Severe pain not relieved by pain medication. Incision starts to come apart. Follow Up Appt Call today for appointment in 3 weeks [phone]) or for problems.  If you have any hardware placed in your spine, you will need an x-ray before your appointment. 

## 2018-09-20 NOTE — Progress Notes (Signed)
Patient alert and oriented, mae's well, voiding adequate amount of urine, swallowing without difficulty, no c/o pain at time of discharge. Patient discharged home with family. Script and discharged instructions given to patient. Patient and family stated understanding of instructions given. Patient has an appointment with Dr. Nudelman 

## 2018-10-09 DIAGNOSIS — R030 Elevated blood-pressure reading, without diagnosis of hypertension: Secondary | ICD-10-CM | POA: Diagnosis not present

## 2018-10-09 DIAGNOSIS — M47816 Spondylosis without myelopathy or radiculopathy, lumbar region: Secondary | ICD-10-CM | POA: Diagnosis not present

## 2018-10-09 DIAGNOSIS — M48062 Spinal stenosis, lumbar region with neurogenic claudication: Secondary | ICD-10-CM | POA: Diagnosis not present

## 2018-10-09 DIAGNOSIS — Z6828 Body mass index (BMI) 28.0-28.9, adult: Secondary | ICD-10-CM | POA: Diagnosis not present

## 2018-10-09 DIAGNOSIS — Z981 Arthrodesis status: Secondary | ICD-10-CM | POA: Diagnosis not present

## 2018-10-09 DIAGNOSIS — M5136 Other intervertebral disc degeneration, lumbar region: Secondary | ICD-10-CM | POA: Diagnosis not present

## 2018-10-20 ENCOUNTER — Other Ambulatory Visit: Payer: Self-pay | Admitting: Adult Health

## 2018-10-22 NOTE — Telephone Encounter (Signed)
Sent to the pharmacy by e-scribe. 

## 2018-10-23 ENCOUNTER — Other Ambulatory Visit: Payer: Self-pay | Admitting: Adult Health

## 2018-10-23 DIAGNOSIS — E038 Other specified hypothyroidism: Secondary | ICD-10-CM

## 2018-10-24 ENCOUNTER — Encounter: Payer: Self-pay | Admitting: Adult Health

## 2018-10-24 ENCOUNTER — Ambulatory Visit (INDEPENDENT_AMBULATORY_CARE_PROVIDER_SITE_OTHER): Payer: Medicare HMO | Admitting: Adult Health

## 2018-10-24 VITALS — BP 118/80 | Temp 97.8°F | Ht 65.25 in | Wt 182.0 lb

## 2018-10-24 DIAGNOSIS — E782 Mixed hyperlipidemia: Secondary | ICD-10-CM

## 2018-10-24 DIAGNOSIS — M5126 Other intervertebral disc displacement, lumbar region: Secondary | ICD-10-CM

## 2018-10-24 DIAGNOSIS — Z0000 Encounter for general adult medical examination without abnormal findings: Secondary | ICD-10-CM

## 2018-10-24 DIAGNOSIS — Z23 Encounter for immunization: Secondary | ICD-10-CM | POA: Diagnosis not present

## 2018-10-24 DIAGNOSIS — E039 Hypothyroidism, unspecified: Secondary | ICD-10-CM

## 2018-10-24 LAB — CBC WITH DIFFERENTIAL/PLATELET
Basophils Absolute: 0.1 10*3/uL (ref 0.0–0.1)
Basophils Relative: 1.2 % (ref 0.0–3.0)
Eosinophils Absolute: 0.2 10*3/uL (ref 0.0–0.7)
Eosinophils Relative: 3.2 % (ref 0.0–5.0)
HCT: 41.5 % (ref 36.0–46.0)
HEMOGLOBIN: 13.9 g/dL (ref 12.0–15.0)
Lymphocytes Relative: 26.5 % (ref 12.0–46.0)
Lymphs Abs: 1.6 10*3/uL (ref 0.7–4.0)
MCHC: 33.6 g/dL (ref 30.0–36.0)
MCV: 91.1 fl (ref 78.0–100.0)
MONOS PCT: 7.4 % (ref 3.0–12.0)
Monocytes Absolute: 0.5 10*3/uL (ref 0.1–1.0)
Neutro Abs: 3.8 10*3/uL (ref 1.4–7.7)
Neutrophils Relative %: 61.7 % (ref 43.0–77.0)
Platelets: 328 10*3/uL (ref 150.0–400.0)
RBC: 4.55 Mil/uL (ref 3.87–5.11)
RDW: 13.4 % (ref 11.5–15.5)
WBC: 6.2 10*3/uL (ref 4.0–10.5)

## 2018-10-24 LAB — COMPREHENSIVE METABOLIC PANEL
ALBUMIN: 4.5 g/dL (ref 3.5–5.2)
ALT: 40 U/L — ABNORMAL HIGH (ref 0–35)
AST: 38 U/L — ABNORMAL HIGH (ref 0–37)
Alkaline Phosphatase: 167 U/L — ABNORMAL HIGH (ref 39–117)
BUN: 19 mg/dL (ref 6–23)
CO2: 29 mEq/L (ref 19–32)
Calcium: 9.7 mg/dL (ref 8.4–10.5)
Chloride: 104 mEq/L (ref 96–112)
Creatinine, Ser: 0.8 mg/dL (ref 0.40–1.20)
GFR: 71.31 mL/min (ref >60.00)
Glucose, Bld: 101 mg/dL — ABNORMAL HIGH (ref 70–99)
Potassium: 4.2 mEq/L (ref 3.5–5.1)
Sodium: 141 mEq/L (ref 135–145)
Total Bilirubin: 0.7 mg/dL (ref 0.2–1.2)
Total Protein: 6.8 g/dL (ref 6.0–8.3)

## 2018-10-24 LAB — LIPID PANEL
Cholesterol: 224 mg/dL — ABNORMAL HIGH (ref 0–200)
HDL: 51.2 mg/dL (ref >39.00)
NonHDL: 172.78
Total CHOL/HDL Ratio: 4
Triglycerides: 218 mg/dL — ABNORMAL HIGH (ref 0.0–149.0)
VLDL: 43.6 mg/dL — ABNORMAL HIGH (ref 0.0–40.0)

## 2018-10-24 LAB — TSH: TSH: 5.91 u[IU]/mL — ABNORMAL HIGH (ref 0.35–4.50)

## 2018-10-24 LAB — LDL CHOLESTEROL, DIRECT: Direct LDL: 150 mg/dL

## 2018-10-24 NOTE — Patient Instructions (Signed)
It was great seeing you this morning and I am glad you are recovering well.   We will follow up with you regarding your blood work   Please let me know if you need anything

## 2018-10-24 NOTE — Progress Notes (Signed)
Subjective:    Patient ID: Angela Houston, female    DOB: Apr 30, 1951, 68 y.o.   MRN: [id number]  HPI Patient presents for yearly preventative medicine examination. Pleasant 68 year old female who  has a past medical history of Diverticulitis, History of kidney stones, Hyperlipidemia, Hypothyroidism, Kidney stones, Mitral valve prolapse, Ovarian cyst, PONV (postoperative nausea and vomiting), and Thyroid disease.  Hypothyroidism - takes synthroid 112 mcg daily  Lab Results  Component Value Date   TSH 1.22 10/04/2017   Hyperlipidemia - Takes Lipitor 20 mg and ASA 81 mg daily  Lab Results  Component Value Date   CHOL 207 (H) 10/04/2017   HDL 48.20 10/04/2017   LDLCALC 102 (H) 07/01/2014   LDLDIRECT 117.0 10/04/2017   TRIG 255.0 (H) 10/04/2017   CHOLHDL 4 10/04/2017    Interval History - underwent an elective  L4-5 lumbar decompression and arthrodesis in January 2020. She reports doing well post op. Continues to wear abd binder. Will start PT in March/April. She is not having to take narcotic pain medication   All immunizations and health maintenance protocols were reviewed with the patient and needed orders were placed. Needs flu shot   Appropriate screening laboratory values were ordered for the patient including screening of hyperlipidemia, renal function and hepatic function.  Medication reconciliation,  past medical history, social history, problem list and allergies were reviewed in detail with the patient  Goals were established with regard to weight loss, exercise, and  diet in compliance with medications. She has been unable to exercise due to back surgery. She is trying to eat healthy  Wt Readings from Last 3 Encounters:  10/24/18 182 lb (82.6 kg)  09/19/18 184 lb (83.5 kg)  06/03/18 177 lb 6 oz (80.5 kg)   End of life planning was discussed.  She I up to date on routine mammograms and screening colonoscopy. She participates in dental and vision screens     Review of  Systems  Constitutional: Negative.   HENT: Negative.   Eyes: Negative.   Respiratory: Negative.   Cardiovascular: Negative.   Gastrointestinal: Negative.   Endocrine: Negative.   Genitourinary: Negative.   Musculoskeletal: Negative.   Skin: Negative.   Allergic/Immunologic: Negative.   Neurological: Negative.   Psychiatric/Behavioral: Negative.   All other systems reviewed and are negative.  Past Medical History:  Diagnosis Date  . Diverticulitis   . History of kidney stones   . Hyperlipidemia   . Hypothyroidism   . Kidney stones   . Mitral valve prolapse   . Ovarian cyst   . PONV (postoperative nausea and vomiting)   . Thyroid disease     Social History   Socioeconomic History  . Marital status: Married    Spouse name: Not on file  . Number of children: 3  . Years of education: Not on file  . Highest education level: Not on file  Occupational History  . Occupation: housewife  Social Needs  . Financial resource strain: Not on file  . Food insecurity:    Worry: Not on file    Inability: Not on file  . Transportation needs:    Medical: Not on file    Non-medical: Not on file  Tobacco Use  . Smoking status: Never Smoker  . Smokeless tobacco: Never Used  Substance and Sexual Activity  . Alcohol use: No  . Drug use: No  . Sexual activity: Not on file  Lifestyle  . Physical activity:    Days per week:  Not on file    Minutes per session: Not on file  . Stress: Not on file  Relationships  . Social connections:    Talks on phone: Not on file    Gets together: Not on file    Attends religious service: Not on file    Active member of club or organization: Not on file    Attends meetings of clubs or organizations: Not on file    Relationship status: Not on file  . Intimate partner violence:    Fear of current or ex partner: Not on file    Emotionally abused: Not on file    Physically abused: Not on file    Forced sexual activity: Not on file  Other Topics  Concern  . Not on file  Social History Narrative  . Not on file    Past Surgical History:  Procedure Laterality Date  . APPENDECTOMY    . BREAST EXCISIONAL BIOPSY Right   . COLONOSCOPY  2018  . OVARIAN CYST REMOVAL    . TONSILLECTOMY    . TUBAL LIGATION      Family History  Problem Relation Age of Onset  . COPD Father        smoker  . Hyperlipidemia Father   . Glaucoma Father   . Heart disease Father   . Breast cancer Sister   . Colon cancer Neg Hx     Allergies  Allergen Reactions  . Tizanidine Hcl Anaphylaxis  . Sulfamethoxazole-Trimethoprim Rash and Other (See Comments)    fever,headache,chills    Current Outpatient Medications on File Prior to Visit  Medication Sig Dispense Refill  . aspirin 81 MG tablet Take 81 mg by mouth daily.      Marland Kitchen atorvastatin (LIPITOR) 20 MG tablet TAKE 1 TABLET BY MOUTH DAILY 90 tablet 0  . b complex vitamins tablet Take 1 tablet by mouth daily.    . Calcium Carbonate-Vit D-Min (CALCIUM 1200 PO) Take 1,200 mg by mouth daily.    . Cholecalciferol (VITAMIN D) 2000 UNITS CAPS Take 2,000 Units by mouth daily.      Marland Kitchen CO-ENZYME Q-10 PO Take 100 mg by mouth daily.     Marland Kitchen dicyclomine (BENTYL) 10 MG capsule Take 10 mg by mouth daily as needed for spasms.     . Flaxseed, Linseed, (FLAXSEED OIL) 1000 MG CAPS Take 1,000 mg by mouth daily.    Marland Kitchen HYDROcodone-acetaminophen (NORCO/VICODIN) 5-325 MG tablet Take 1-2 tablets by mouth every 4 (four) hours as needed (pain). 40 tablet 0  . hydroxypropyl methylcellulose / hypromellose (ISOPTO TEARS / GONIOVISC) 2.5 % ophthalmic solution Place 1 drop into both eyes 4 (four) times daily as needed for dry eyes.    Marland Kitchen levothyroxine (SYNTHROID, LEVOTHROID) 112 MCG tablet Take 1 tablet (112 mcg total) by mouth daily. 90 tablet 3  . Lutein 20 MG CAPS Take 20 mg by mouth daily.     No current facility-administered medications on file prior to visit.     BP 118/80   Temp 97.8 F (36.6 C)   Ht 5' 5.25" (1.657 m)   Wt  182 lb (82.6 kg)   BMI 30.05 kg/m       Objective:   Physical Exam Vitals signs and nursing note reviewed.  Constitutional:      General: She is not in acute distress.    Appearance: Normal appearance. She is well-developed and normal weight.  HENT:     Head: Normocephalic and atraumatic.     Right  Ear: Tympanic membrane, ear canal and external ear normal. There is no impacted cerumen.     Left Ear: Tympanic membrane, ear canal and external ear normal. There is no impacted cerumen.     Nose: Nose normal. No congestion.     Mouth/Throat:     Mouth: Mucous membranes are moist.     Pharynx: Oropharynx is clear. No oropharyngeal exudate.  Eyes:     General:        Right eye: No discharge.        Left eye: No discharge.     Conjunctiva/sclera: Conjunctivae normal.  Neck:     Musculoskeletal: Normal range of motion and neck supple. No neck rigidity or muscular tenderness.     Thyroid: No thyromegaly.     Vascular: No carotid bruit.     Trachea: No tracheal deviation.  Cardiovascular:     Rate and Rhythm: Normal rate and regular rhythm.     Heart sounds: Normal heart sounds. No murmur. No friction rub. No gallop.   Pulmonary:     Effort: Pulmonary effort is normal. No respiratory distress.     Breath sounds: Normal breath sounds. No stridor. No wheezing, rhonchi or rales.  Chest:     Chest wall: No tenderness.  Abdominal:     General: Bowel sounds are normal. There is no distension.     Palpations: Abdomen is soft. There is no mass.     Tenderness: There is no abdominal tenderness. There is no right CVA tenderness, left CVA tenderness, guarding or rebound.     Hernia: No hernia is present.  Musculoskeletal: Normal range of motion.        General: No swelling, tenderness, deformity or signs of injury.     Right lower leg: No edema.     Left lower leg: No edema.  Lymphadenopathy:     Cervical: No cervical adenopathy.  Skin:    General: Skin is warm and dry.     Capillary  Refill: Capillary refill takes less than 2 seconds.     Coloration: Skin is not jaundiced or pale.     Findings: No bruising, erythema, lesion or rash.     Comments: Well healed surgical scar on lower lumbar spine    Neurological:     General: No focal deficit present.     Mental Status: She is alert and oriented to person, place, and time. Mental status is at baseline.     Cranial Nerves: No cranial nerve deficit.     Sensory: No sensory deficit.     Motor: No weakness.     Coordination: Coordination normal.     Gait: Gait normal.     Deep Tendon Reflexes: Reflexes normal.  Psychiatric:        Mood and Affect: Mood normal.        Behavior: Behavior normal.        Thought Content: Thought content normal.        Judgment: Judgment normal.        Assessment & Plan:  1. Routine general medical examination at a health care facility - Follow up in one year or sooner if needed  - CBC with Differential/Platelet - Comprehensive metabolic panel - Lipid panel - TSH  2. Mixed hyperlipidemia - Consider change in statin  - CBC with Differential/Platelet - Comprehensive metabolic panel - Lipid panel - TSH  3. Hypothyroidism, unspecified type - Consider change in synthroid  - CBC with Differential/Platelet - Comprehensive metabolic panel -  Lipid panel - TSH  4. Lumbar disc herniation - Continue with plan of care by Neurosurgery   5. Need for influenza vaccination  - Flu vaccine HIGH DOSE PF (Fluzone High dose)  Dorothyann Peng, NP

## 2018-10-24 NOTE — Telephone Encounter (Signed)
Sent to the pharmacy by e-scribe. 

## 2018-10-29 ENCOUNTER — Telehealth: Payer: Self-pay | Admitting: Adult Health

## 2018-10-29 ENCOUNTER — Ambulatory Visit: Payer: Self-pay | Admitting: Adult Health

## 2018-10-29 ENCOUNTER — Encounter: Payer: Self-pay | Admitting: Family Medicine

## 2018-10-29 NOTE — Telephone Encounter (Signed)
Chart opened to give lab results however pt had hung up.

## 2018-10-29 NOTE — Telephone Encounter (Signed)
Copied from [place] [id number]. Topic: Quick Communication - Lab Results (Clinic Use ONLY) >> Oct 29, 2018 11:09 AM Miles Costain T, CMA wrote: Called patient to inform them of all lab results from 10/24/2018. When patient returns call, triage nurse may disclose results. >> Oct 29, 2018  1:34 PM Alfredia Ferguson R wrote: Patient returned call for results

## 2018-10-29 NOTE — Telephone Encounter (Signed)
Provided lab results  Per   Dorothyann Peng,  NP  10/24/18 to Patient  .  Patient  Voiced  Understanding.  Scheduled  Repeat  Lab  Appointment  For  12/27/18  At  8:00 am  Patient  Voices  Understanding.

## 2018-12-09 DIAGNOSIS — Z6828 Body mass index (BMI) 28.0-28.9, adult: Secondary | ICD-10-CM | POA: Diagnosis not present

## 2018-12-09 DIAGNOSIS — R030 Elevated blood-pressure reading, without diagnosis of hypertension: Secondary | ICD-10-CM | POA: Diagnosis not present

## 2018-12-09 DIAGNOSIS — Z981 Arthrodesis status: Secondary | ICD-10-CM | POA: Diagnosis not present

## 2018-12-09 DIAGNOSIS — M5136 Other intervertebral disc degeneration, lumbar region: Secondary | ICD-10-CM | POA: Diagnosis not present

## 2018-12-09 DIAGNOSIS — M47816 Spondylosis without myelopathy or radiculopathy, lumbar region: Secondary | ICD-10-CM | POA: Diagnosis not present

## 2018-12-27 ENCOUNTER — Other Ambulatory Visit: Payer: Self-pay

## 2018-12-27 ENCOUNTER — Other Ambulatory Visit: Payer: Self-pay | Admitting: Adult Health

## 2018-12-27 ENCOUNTER — Other Ambulatory Visit (INDEPENDENT_AMBULATORY_CARE_PROVIDER_SITE_OTHER): Payer: Medicare HMO

## 2018-12-27 DIAGNOSIS — E039 Hypothyroidism, unspecified: Secondary | ICD-10-CM | POA: Diagnosis not present

## 2018-12-27 DIAGNOSIS — E782 Mixed hyperlipidemia: Secondary | ICD-10-CM

## 2018-12-27 LAB — COMPREHENSIVE METABOLIC PANEL
ALT: 34 U/L (ref 0–35)
AST: 35 U/L (ref 0–37)
Albumin: 4.2 g/dL (ref 3.5–5.2)
Alkaline Phosphatase: 160 U/L — ABNORMAL HIGH (ref 39–117)
BUN: 16 mg/dL (ref 6–23)
CO2: 29 mEq/L (ref 19–32)
Calcium: 9.6 mg/dL (ref 8.4–10.5)
Chloride: 105 mEq/L (ref 96–112)
Creatinine, Ser: 0.93 mg/dL (ref 0.40–1.20)
GFR: 59.91 mL/min — ABNORMAL LOW (ref >60.00)
Glucose, Bld: 96 mg/dL (ref 70–99)
Potassium: 4.5 mEq/L (ref 3.5–5.1)
Sodium: 142 mEq/L (ref 135–145)
Total Bilirubin: 0.5 mg/dL (ref 0.2–1.2)
Total Protein: 6.5 g/dL (ref 6.0–8.3)

## 2018-12-27 LAB — LIPID PANEL
Cholesterol: 190 mg/dL (ref 0–200)
HDL: 44.4 mg/dL (ref >39.00)
NonHDL: 145.16
Total CHOL/HDL Ratio: 4
Triglycerides: 239 mg/dL — ABNORMAL HIGH (ref 0.0–149.0)
VLDL: 47.8 mg/dL — ABNORMAL HIGH (ref 0.0–40.0)

## 2018-12-27 LAB — TSH: TSH: 2.81 u[IU]/mL (ref 0.35–4.50)

## 2018-12-27 LAB — LDL CHOLESTEROL, DIRECT: Direct LDL: 105 mg/dL

## 2018-12-27 MED ORDER — ATORVASTATIN CALCIUM 40 MG PO TABS: 40.0000 mg | ORAL_TABLET | Freq: Every day | ORAL | 3 refills | Status: DC

## 2018-12-27 NOTE — Telephone Encounter (Signed)
Requested medication (s) are due for refill today: amt # not specified  Requested medication (s) are on the active medication list: historic med  Last refill:  10/29/18  Future visit scheduled: no  Notes to clinic:  historical medication and provider   Requested Prescriptions  Pending Prescriptions Disp Refills   atorvastatin (LIPITOR) 40 MG tablet      Sig: Take 1 tablet (40 mg total) by mouth daily.     Cardiovascular:  Antilipid - Statins Failed - 12/27/2018  3:01 PM      Failed - LDL in normal range and within 360 days    LDL Cholesterol  Date Value Ref Range Status  07/01/2014 102 (H) 0 - 99 mg/dL Final         Failed - Triglycerides in normal range and within 360 days    Triglycerides  Date Value Ref Range Status  12/27/2018 239.0 (H) 0.0 - 149.0 mg/dL Final    Comment:    Normal:  <150 mg/dLBorderline High:  150 - 199 mg/dL         Passed - Total Cholesterol in normal range and within 360 days    Cholesterol  Date Value Ref Range Status  12/27/2018 190 0 - 200 mg/dL Final    Comment:    ATP III Classification       Desirable:  < 200 mg/dL               Borderline High:  200 - 239 mg/dL          High:  > = 240 mg/dL         Passed - HDL in normal range and within 360 days    HDL  Date Value Ref Range Status  12/27/2018 44.40 >39.00 mg/dL Final         Passed - Patient is not pregnant      Passed - Valid encounter within last 12 months    Recent Outpatient Visits          2 months ago Routine general medical examination at a health care facility   [facility] [place], [place], NP   6 months ago Acute bronchitis, unspecified organism   [facility], Ishmael Holter, MD   1 year ago Lumbar radiculopathy   [facility] at [facility], [place], NP   1 year ago Left leg pain   [facility]rap[facility] at [facility], Malka So, MD   1 year ago Routine general medical examination at a health care facility   [facility] at [place], [place], NP

## 2019-01-17 ENCOUNTER — Other Ambulatory Visit: Payer: Self-pay | Admitting: Adult Health

## 2019-01-17 NOTE — Telephone Encounter (Signed)
DENIED.  FILLED ON 12/27/2018 FOR 1 YEAR.  MESSAGE SENT TO THE PHARMACY.

## 2019-04-01 ENCOUNTER — Telehealth: Payer: Self-pay | Admitting: Adult Health

## 2019-04-01 NOTE — Telephone Encounter (Signed)
Pt is scheduled 04/02/2019 for appt.  Nothing further needed.

## 2019-04-01 NOTE — Telephone Encounter (Signed)
Pt stated she has gotten into poison sumac. Requesting callback from [facility]. She would like for [facility] to send in rx to her pharmacy. Please advise.  [id number] - [place], [place] [phone] (Phone) [phone] (Fax)

## 2019-04-02 ENCOUNTER — Encounter: Payer: Self-pay | Admitting: Adult Health

## 2019-04-02 ENCOUNTER — Ambulatory Visit (INDEPENDENT_AMBULATORY_CARE_PROVIDER_SITE_OTHER): Payer: Medicare HMO | Admitting: Adult Health

## 2019-04-02 DIAGNOSIS — L247 Irritant contact dermatitis due to plants, except food: Secondary | ICD-10-CM | POA: Diagnosis not present

## 2019-04-02 MED ORDER — PREDNISONE 10 MG PO TABS: ORAL_TABLET | ORAL | 0 refills | Status: DC

## 2019-04-02 NOTE — Progress Notes (Signed)
Virtual Visit via Telephone Note  I connected with Angela Houston on 04/02/19 at  3:30 PM EDT by telephone and verified that I am speaking with the correct person using two identifiers.   I discussed the limitations, risks, security and privacy concerns of performing an evaluation and management service by telephone and the availability of in person appointments. I also discussed with the patient that there may be a patient responsible charge related to this service. The patient expressed understanding and agreed to proceed.  Location patient: home Location provider: work or home office Participants present for the call: patient, provider Patient did not have a visit in the prior 7 days to address this/these issue(s).   History of Present Illness: This 68 year old female who is being evaluated today for an acute issue.  She reports earlier this week she was out working in the yard and came in contact with some poison oak or poison sumac.  She reports a red itchy rash with blisters on her left wrist, left foot left forearm and right forearm.  She denies any signs of infection.  She has been using various over-the-counter creams to help with the itching but this has not resolved.  She denies SOB or trouble breathing    Observations/Objective: Patient sounds cheerful and well on the phone. I do not appreciate any SOB. Speech and thought processing are grossly intact. Patient reported vitals:  Assessment and Plan: 1. Irritant contact dermatitis due to plants, except food - predniSONE (DELTASONE) 10 MG tablet; Take two tabs ( 20 mg) x 7 days and then 1 tab ( 10 mg) x 7 days.  Dispense: 21 tablet; Refill: 0 - Continue with Calamine Lotion  - Follow up with signs of infection   Follow Up Instructions:  I did not refer this patient for an OV in the next 24 hours for this/these issue(s).  I discussed the assessment and treatment plan with the patient. The patient was provided an opportunity to  ask questions and all were answered. The patient agreed with the plan and demonstrated an understanding of the instructions.   The patient was advised to call back or seek an in-person evaluation if the symptoms worsen or if the condition fails to improve as anticipated.  I provided 15  minutes of non-face-to-face time during this encounter.   Dorothyann Peng, NP

## 2019-04-08 DIAGNOSIS — Z981 Arthrodesis status: Secondary | ICD-10-CM | POA: Diagnosis not present

## 2019-04-29 DIAGNOSIS — Z0101 Encounter for examination of eyes and vision with abnormal findings: Secondary | ICD-10-CM | POA: Diagnosis not present

## 2019-07-08 DIAGNOSIS — R69 Illness, unspecified: Secondary | ICD-10-CM | POA: Diagnosis not present

## 2019-08-19 DIAGNOSIS — H2513 Age-related nuclear cataract, bilateral: Secondary | ICD-10-CM | POA: Diagnosis not present

## 2019-08-19 DIAGNOSIS — H5203 Hypermetropia, bilateral: Secondary | ICD-10-CM | POA: Diagnosis not present

## 2019-08-19 DIAGNOSIS — H524 Presbyopia: Secondary | ICD-10-CM | POA: Diagnosis not present

## 2019-08-19 DIAGNOSIS — H04123 Dry eye syndrome of bilateral lacrimal glands: Secondary | ICD-10-CM | POA: Diagnosis not present

## 2019-10-27 ENCOUNTER — Other Ambulatory Visit: Payer: Self-pay | Admitting: Adult Health

## 2019-10-27 DIAGNOSIS — E038 Other specified hypothyroidism: Secondary | ICD-10-CM

## 2019-10-28 DIAGNOSIS — M47816 Spondylosis without myelopathy or radiculopathy, lumbar region: Secondary | ICD-10-CM | POA: Diagnosis not present

## 2019-10-28 DIAGNOSIS — M5136 Other intervertebral disc degeneration, lumbar region: Secondary | ICD-10-CM | POA: Diagnosis not present

## 2019-10-28 NOTE — Telephone Encounter (Signed)
Patient need to schedule an ov for more refills. PT needs a CPE.

## 2019-11-10 ENCOUNTER — Other Ambulatory Visit: Payer: Self-pay

## 2019-11-11 ENCOUNTER — Ambulatory Visit (INDEPENDENT_AMBULATORY_CARE_PROVIDER_SITE_OTHER): Payer: Medicare HMO | Admitting: Adult Health

## 2019-11-11 ENCOUNTER — Encounter: Payer: Self-pay | Admitting: Adult Health

## 2019-11-11 VITALS — BP 130/82 | Temp 97.3°F | Ht 66.75 in | Wt 181.0 lb

## 2019-11-11 DIAGNOSIS — Z23 Encounter for immunization: Secondary | ICD-10-CM

## 2019-11-11 DIAGNOSIS — Z0000 Encounter for general adult medical examination without abnormal findings: Secondary | ICD-10-CM

## 2019-11-11 DIAGNOSIS — E039 Hypothyroidism, unspecified: Secondary | ICD-10-CM

## 2019-11-11 DIAGNOSIS — E782 Mixed hyperlipidemia: Secondary | ICD-10-CM | POA: Diagnosis not present

## 2019-11-11 LAB — COMPREHENSIVE METABOLIC PANEL
ALT: 35 U/L (ref 0–35)
AST: 34 U/L (ref 0–37)
Albumin: 4.4 g/dL (ref 3.5–5.2)
Alkaline Phosphatase: 114 U/L (ref 39–117)
BUN: 18 mg/dL (ref 6–23)
CO2: 31 mEq/L (ref 19–32)
Calcium: 10.4 mg/dL (ref 8.4–10.5)
Chloride: 102 mEq/L (ref 96–112)
Creatinine, Ser: 0.87 mg/dL (ref 0.40–1.20)
GFR: 64.53 mL/min (ref >60.00)
Glucose, Bld: 96 mg/dL (ref 70–99)
Potassium: 4.5 mEq/L (ref 3.5–5.1)
Sodium: 140 mEq/L (ref 135–145)
Total Bilirubin: 0.7 mg/dL (ref 0.2–1.2)
Total Protein: 7.1 g/dL (ref 6.0–8.3)

## 2019-11-11 LAB — TSH: TSH: 6.46 u[IU]/mL — ABNORMAL HIGH (ref 0.35–4.50)

## 2019-11-11 LAB — CBC WITH DIFFERENTIAL/PLATELET
Basophils Absolute: 0.1 10*3/uL (ref 0.0–0.1)
Basophils Relative: 0.9 % (ref 0.0–3.0)
Eosinophils Absolute: 0.2 10*3/uL (ref 0.0–0.7)
Eosinophils Relative: 2.4 % (ref 0.0–5.0)
HCT: 44.1 % (ref 36.0–46.0)
Hemoglobin: 15.1 g/dL — ABNORMAL HIGH (ref 12.0–15.0)
Lymphocytes Relative: 19 % (ref 12.0–46.0)
Lymphs Abs: 1.7 10*3/uL (ref 0.7–4.0)
MCHC: 34.2 g/dL (ref 30.0–36.0)
MCV: 91.8 fl (ref 78.0–100.0)
Monocytes Absolute: 0.5 10*3/uL (ref 0.1–1.0)
Monocytes Relative: 5.5 % (ref 3.0–12.0)
Neutro Abs: 6.3 10*3/uL (ref 1.4–7.7)
Neutrophils Relative %: 72.2 % (ref 43.0–77.0)
Platelets: 331 10*3/uL (ref 150.0–400.0)
RBC: 4.8 Mil/uL (ref 3.87–5.11)
RDW: 12.7 % (ref 11.5–15.5)
WBC: 8.8 10*3/uL (ref 4.0–10.5)

## 2019-11-11 LAB — LIPID PANEL
Cholesterol: 211 mg/dL — ABNORMAL HIGH (ref 0–200)
HDL: 56.9 mg/dL (ref >39.00)
NonHDL: 153.95
Total CHOL/HDL Ratio: 4
Triglycerides: 289 mg/dL — ABNORMAL HIGH (ref 0.0–149.0)
VLDL: 57.8 mg/dL — ABNORMAL HIGH (ref 0.0–40.0)

## 2019-11-11 LAB — LDL CHOLESTEROL, DIRECT: Direct LDL: 118 mg/dL

## 2019-11-11 NOTE — Progress Notes (Addendum)
Subjective:    Patient ID: Angela Houston, female    DOB: 1951/07/06, 69 y.o.   MRN: [id number]  HPI Patient presents for yearly preventative medicine examination. Pleasant 69 year old female who  has a past medical history of Diverticulitis, History of kidney stones, Hyperlipidemia, Hypothyroidism, Kidney stones, Mitral valve prolapse, Ovarian cyst, PONV (postoperative nausea and vomiting), and Thyroid disease.  Hypothyroidism-takes Synthroid 112 mcg daily.  She feels well controlled on this medication  Hyperlipidemia-takes Lipitor 40 mg and aspirin 81 mg daily.  She denies fatigue or myalgia Lab Results  Component Value Date   CHOL 190 12/27/2018   HDL 44.40 12/27/2018   LDLCALC 102 (H) 07/01/2014   LDLDIRECT 105.0 12/27/2018   TRIG 239.0 (H) 12/27/2018   CHOLHDL 4 12/27/2018    All immunizations and health maintenance protocols were reviewed with the patient and needed orders were placed.  Appropriate screening laboratory values were ordered for the patient including screening of hyperlipidemia, renal function and hepatic function. If indicated by BPH, a PSA was ordered.  Medication reconciliation,  past medical history, social history, problem list and allergies were reviewed in detail with the patient  Goals were established with regard to weight loss, exercise, and  diet in compliance with medications  Wt Readings from Last 3 Encounters:  11/11/19 181 lb (82.1 kg)  10/24/18 182 lb (82.6 kg)  09/19/18 184 lb (83.5 kg)    He is up-to-date on screening colonoscopy.  She is in need of a routine mammogram and will call to schedule this.    Review of Systems  Constitutional: Negative.   HENT: Negative.   Eyes: Negative.   Respiratory: Negative.   Cardiovascular: Negative.   Gastrointestinal: Negative.   Endocrine: Negative.   Genitourinary: Negative.   Musculoskeletal: Negative.   Skin: Negative.   Allergic/Immunologic: Negative.   Neurological: Negative.     Hematological: Negative.   Psychiatric/Behavioral: Negative.    Past Medical History:  Diagnosis Date  . Diverticulitis   . History of kidney stones   . Hyperlipidemia   . Hypothyroidism   . Kidney stones   . Mitral valve prolapse   . Ovarian cyst   . PONV (postoperative nausea and vomiting)   . Thyroid disease     Social History   Socioeconomic History  . Marital status: Married    Spouse name: Not on file  . Number of children: 3  . Years of education: Not on file  . Highest education level: Not on file  Occupational History  . Occupation: housewife  Tobacco Use  . Smoking status: Never Smoker  . Smokeless tobacco: Never Used  Substance and Sexual Activity  . Alcohol use: No  . Drug use: No  . Sexual activity: Not on file  Other Topics Concern  . Not on file  Social History Narrative  . Not on file   Social Determinants of Health   Financial Resource Strain:   . Difficulty of Paying Living Expenses: Not on file  Food Insecurity:   . Worried About Charity fundraiser in the Last Year: Not on file  . Ran Out of Food in the Last Year: Not on file  Transportation Needs:   . Lack of Transportation (Medical): Not on file  . Lack of Transportation (Non-Medical): Not on file  Physical Activity:   . Days of Exercise per Week: Not on file  . Minutes of Exercise per Session: Not on file  Stress:   . Feeling of Stress :  Not on file  Social Connections:   . Frequency of Communication with Friends and Family: Not on file  . Frequency of Social Gatherings with Friends and Family: Not on file  . Attends Religious Services: Not on file  . Active Member of Clubs or Organizations: Not on file  . Attends Archivist Meetings: Not on file  . Marital Status: Not on file  Intimate Partner Violence:   . Fear of Current or Ex-Partner: Not on file  . Emotionally Abused: Not on file  . Physically Abused: Not on file  . Sexually Abused: Not on file    Past Surgical  History:  Procedure Laterality Date  . APPENDECTOMY    . BREAST EXCISIONAL BIOPSY Right   . COLONOSCOPY  2018  . OVARIAN CYST REMOVAL    . TONSILLECTOMY    . TUBAL LIGATION      Family History  Problem Relation Age of Onset  . COPD Father        smoker  . Hyperlipidemia Father   . Glaucoma Father   . Heart disease Father   . Breast cancer Sister   . Colon cancer Neg Hx     Allergies  Allergen Reactions  . Tizanidine Hcl Anaphylaxis  . Sulfamethoxazole-Trimethoprim Rash and Other (See Comments)    fever,headache,chills    Current Outpatient Medications on File Prior to Visit  Medication Sig Dispense Refill  . aspirin 81 MG tablet Take 81 mg by mouth daily.      Marland Kitchen atorvastatin (LIPITOR) 40 MG tablet Take 1 tablet (40 mg total) by mouth daily. 90 tablet 3  . b complex vitamins tablet Take 1 tablet by mouth daily.    . Calcium Carbonate-Vit D-Min (CALCIUM 1200 PO) Take 1,200 mg by mouth daily.    . Cholecalciferol (VITAMIN D) 2000 UNITS CAPS Take 2,000 Units by mouth daily.      Marland Kitchen CO-ENZYME Q-10 PO Take 100 mg by mouth daily.     . diclofenac (VOLTAREN) 75 MG EC tablet Take 75 mg by mouth 2 (two) times daily.    Marland Kitchen dicyclomine (BENTYL) 10 MG capsule Take 10 mg by mouth daily as needed for spasms.     . Flaxseed, Linseed, (FLAXSEED OIL) 1000 MG CAPS Take 1,000 mg by mouth daily.    . hydroxypropyl methylcellulose / hypromellose (ISOPTO TEARS / GONIOVISC) 2.5 % ophthalmic solution Place 1 drop into both eyes 4 (four) times daily as needed for dry eyes.    Marland Kitchen levothyroxine (SYNTHROID) 112 MCG tablet TAKE 1 TABLET BY MOUTH EVERY DAY 30 tablet 0  . Lutein 20 MG CAPS Take 20 mg by mouth daily.     No current facility-administered medications on file prior to visit.    BP 130/82   Temp (!) 97.3 F (36.3 C) (Temporal)   Ht 5' 6.75" (1.695 m)   Wt 181 lb (82.1 kg)   BMI 28.56 kg/m       Objective:   Physical Exam Vitals and nursing note reviewed.  Constitutional:       General: She is not in acute distress.    Appearance: Normal appearance. She is well-developed. She is not ill-appearing.  HENT:     Head: Normocephalic and atraumatic.     Right Ear: Tympanic membrane, ear canal and external ear normal. There is no impacted cerumen.     Left Ear: Tympanic membrane, ear canal and external ear normal. There is no impacted cerumen.     Nose: Nose normal.  No congestion or rhinorrhea.     Mouth/Throat:     Mouth: Mucous membranes are moist.     Pharynx: Oropharynx is clear. No oropharyngeal exudate or posterior oropharyngeal erythema.  Eyes:     General:        Right eye: No discharge.        Left eye: No discharge.     Extraocular Movements: Extraocular movements intact.     Conjunctiva/sclera: Conjunctivae normal.     Pupils: Pupils are equal, round, and reactive to light.  Neck:     Thyroid: No thyromegaly.     Vascular: No carotid bruit.     Trachea: No tracheal deviation.  Cardiovascular:     Rate and Rhythm: Normal rate and regular rhythm.     Pulses: Normal pulses.     Heart sounds: Normal heart sounds. No murmur. No friction rub. No gallop.   Pulmonary:     Effort: Pulmonary effort is normal. No respiratory distress.     Breath sounds: Normal breath sounds. No stridor. No wheezing, rhonchi or rales.  Chest:     Chest wall: No tenderness.  Abdominal:     General: Abdomen is flat. Bowel sounds are normal. There is no distension.     Palpations: Abdomen is soft. There is no mass.     Tenderness: There is no abdominal tenderness. There is no right CVA tenderness, left CVA tenderness, guarding or rebound.     Hernia: No hernia is present.  Musculoskeletal:        General: No swelling, tenderness, deformity or signs of injury. Normal range of motion.     Cervical back: Normal range of motion and neck supple.     Right lower leg: No edema.     Left lower leg: No edema.  Lymphadenopathy:     Cervical: No cervical adenopathy.  Skin:    General:  Skin is warm and dry.     Coloration: Skin is not jaundiced or pale.     Findings: No bruising, erythema, lesion or rash.  Neurological:     General: No focal deficit present.     Mental Status: She is alert and oriented to person, place, and time.     Cranial Nerves: No cranial nerve deficit.     Sensory: No sensory deficit.     Motor: No weakness.     Coordination: Coordination normal.     Gait: Gait normal.     Deep Tendon Reflexes: Reflexes normal.  Psychiatric:        Mood and Affect: Mood normal.        Behavior: Behavior normal.        Thought Content: Thought content normal.        Judgment: Judgment normal.       Assessment & Plan:  1. Routine general medical examination at a health care facility - Continue to stay active and exercise - Follow up in one year or sooner if needed - Flu shot given  - CBC with Differential/Platelet - Comprehensive metabolic panel - Lipid panel - TSH  2. Mixed hyperlipidemia - Consider dose increase of lipitor or add fenofibrate  - CBC with Differential/Platelet - Comprehensive metabolic panel - Lipid panel - TSH  3. Hypothyroidism, unspecified type - Consider dose increase of synthroid  - CBC with Differential/Platelet - Comprehensive metabolic panel - Lipid panel - TSH   Dorothyann Peng, NP

## 2019-11-11 NOTE — Addendum Note (Signed)
Addended by: Miles Costain T on: 11/11/2019 09:39 AM   Modules accepted: Orders

## 2019-11-13 ENCOUNTER — Other Ambulatory Visit: Payer: Self-pay | Admitting: Adult Health

## 2019-11-13 DIAGNOSIS — E782 Mixed hyperlipidemia: Secondary | ICD-10-CM

## 2019-11-22 ENCOUNTER — Other Ambulatory Visit: Payer: Self-pay | Admitting: Adult Health

## 2019-11-22 DIAGNOSIS — E038 Other specified hypothyroidism: Secondary | ICD-10-CM

## 2019-11-26 ENCOUNTER — Other Ambulatory Visit: Payer: Self-pay | Admitting: Family Medicine

## 2019-11-26 DIAGNOSIS — R7989 Other specified abnormal findings of blood chemistry: Secondary | ICD-10-CM

## 2019-11-26 NOTE — Progress Notes (Unsigned)
tt

## 2019-12-16 ENCOUNTER — Other Ambulatory Visit: Payer: Self-pay | Admitting: Adult Health

## 2019-12-16 DIAGNOSIS — Z1231 Encounter for screening mammogram for malignant neoplasm of breast: Secondary | ICD-10-CM

## 2019-12-23 ENCOUNTER — Ambulatory Visit
Admission: RE | Admit: 2019-12-23 | Discharge: 2019-12-23 | Disposition: A | Discharge status: Home / Self Care | Payer: Medicare HMO | Source: Ambulatory Visit | Attending: Adult Health | Admitting: Adult Health

## 2019-12-23 ENCOUNTER — Other Ambulatory Visit: Payer: Self-pay

## 2019-12-23 DIAGNOSIS — Z1231 Encounter for screening mammogram for malignant neoplasm of breast: Secondary | ICD-10-CM | POA: Diagnosis not present

## 2019-12-24 ENCOUNTER — Other Ambulatory Visit: Payer: Self-pay | Admitting: Adult Health

## 2019-12-24 DIAGNOSIS — R928 Other abnormal and inconclusive findings on diagnostic imaging of breast: Secondary | ICD-10-CM

## 2019-12-26 DIAGNOSIS — H43811 Vitreous degeneration, right eye: Secondary | ICD-10-CM | POA: Diagnosis not present

## 2019-12-26 DIAGNOSIS — H2513 Age-related nuclear cataract, bilateral: Secondary | ICD-10-CM | POA: Diagnosis not present

## 2019-12-29 ENCOUNTER — Ambulatory Visit: Payer: Medicare HMO

## 2019-12-29 ENCOUNTER — Other Ambulatory Visit: Payer: Self-pay

## 2019-12-29 ENCOUNTER — Ambulatory Visit
Admission: RE | Admit: 2019-12-29 | Discharge: 2019-12-29 | Disposition: A | Discharge status: Home / Self Care | Payer: Medicare HMO | Source: Ambulatory Visit | Attending: Adult Health | Admitting: Adult Health

## 2019-12-29 DIAGNOSIS — R928 Other abnormal and inconclusive findings on diagnostic imaging of breast: Secondary | ICD-10-CM | POA: Diagnosis not present

## 2020-01-08 ENCOUNTER — Telehealth: Payer: Self-pay | Admitting: Adult Health

## 2020-01-08 MED ORDER — ATORVASTATIN CALCIUM 40 MG PO TABS: 40.0000 mg | ORAL_TABLET | Freq: Every day | ORAL | 0 refills | Status: DC

## 2020-01-08 NOTE — Telephone Encounter (Signed)
Medication:Atorvastatin  Pharmacy: [place]

## 2020-01-08 NOTE — Telephone Encounter (Signed)
Sent to the pharmacy by e-scribe. 

## 2020-01-13 DIAGNOSIS — R69 Illness, unspecified: Secondary | ICD-10-CM | POA: Diagnosis not present

## 2020-01-22 DIAGNOSIS — H2511 Age-related nuclear cataract, right eye: Secondary | ICD-10-CM | POA: Diagnosis not present

## 2020-02-17 ENCOUNTER — Other Ambulatory Visit: Payer: Medicare HMO

## 2020-02-17 ENCOUNTER — Other Ambulatory Visit: Payer: Self-pay

## 2020-02-18 ENCOUNTER — Other Ambulatory Visit (INDEPENDENT_AMBULATORY_CARE_PROVIDER_SITE_OTHER): Payer: Medicare HMO

## 2020-02-18 ENCOUNTER — Other Ambulatory Visit: Payer: Medicare HMO

## 2020-02-18 ENCOUNTER — Other Ambulatory Visit: Payer: Self-pay | Admitting: Adult Health

## 2020-02-18 DIAGNOSIS — E782 Mixed hyperlipidemia: Secondary | ICD-10-CM | POA: Diagnosis not present

## 2020-02-18 DIAGNOSIS — E038 Other specified hypothyroidism: Secondary | ICD-10-CM

## 2020-02-18 DIAGNOSIS — R7989 Other specified abnormal findings of blood chemistry: Secondary | ICD-10-CM | POA: Diagnosis not present

## 2020-02-18 LAB — LDL CHOLESTEROL, DIRECT: Direct LDL: 101 mg/dL

## 2020-02-18 LAB — LIPID PANEL
Cholesterol: 189 mg/dL (ref 0–200)
HDL: 53.8 mg/dL (ref >39.00)
NonHDL: 135.12
Total CHOL/HDL Ratio: 4
Triglycerides: 227 mg/dL — ABNORMAL HIGH (ref 0.0–149.0)
VLDL: 45.4 mg/dL — ABNORMAL HIGH (ref 0.0–40.0)

## 2020-02-18 LAB — TSH: TSH: 1.29 u[IU]/mL (ref 0.35–4.50)

## 2020-02-18 MED ORDER — LEVOTHYROXINE SODIUM 112 MCG PO TABS: 112.0000 ug | ORAL_TABLET | Freq: Every day | ORAL | 2 refills | Status: DC

## 2020-02-19 ENCOUNTER — Other Ambulatory Visit: Payer: Self-pay | Admitting: Family Medicine

## 2020-02-19 ENCOUNTER — Telehealth: Payer: Self-pay | Admitting: Adult Health

## 2020-02-19 MED ORDER — FENOFIBRATE 48 MG PO TABS: 48.0000 mg | ORAL_TABLET | Freq: Every day | ORAL | 3 refills | Status: DC

## 2020-02-19 NOTE — Telephone Encounter (Signed)
Pt is scheduled for 6/18 at 1:00pm with [facility] and aware her medication has been sent in

## 2020-02-19 NOTE — Telephone Encounter (Signed)
Pt stated she will take the medication discussed the other day with Misty/Cory   Pt also stated that she had cataract surgery on the 9th and will do the other eye in a couple weeks. That doctor doing her surgery informed the pt she needs to have an EKG to check up due to her heart skipping.   Pt can be reached at [phone]  Pharmacy: [facility]: [phone]

## 2020-02-19 NOTE — Telephone Encounter (Signed)
Please inform pt that her medication has been sent to the pharmacy.  Schedule her for appt with [facility] for EKG.  Thanks

## 2020-02-26 ENCOUNTER — Other Ambulatory Visit: Payer: Self-pay

## 2020-02-27 ENCOUNTER — Ambulatory Visit (INDEPENDENT_AMBULATORY_CARE_PROVIDER_SITE_OTHER): Payer: Medicare HMO | Admitting: Adult Health

## 2020-02-27 ENCOUNTER — Encounter: Payer: Self-pay | Admitting: Adult Health

## 2020-02-27 VITALS — BP 120/80 | Temp 98.1°F | Wt 177.0 lb

## 2020-02-27 DIAGNOSIS — Z01818 Encounter for other preprocedural examination: Secondary | ICD-10-CM

## 2020-02-27 NOTE — Patient Instructions (Signed)
It was great seeing you today   Your EKG shows something called a PAC - these are common   You can proceed with your cataract removal

## 2020-02-27 NOTE — Progress Notes (Signed)
Subjective:    Patient ID: Angela Houston, female    DOB: 09/26/1950, 69 y.o.   MRN: [id number]  HPI 69 year old female who  has a past medical history of Diverticulitis, History of kidney stones, Hyperlipidemia, Hypothyroidism, Kidney stones, Mitral valve prolapse, Ovarian cyst, PONV (postoperative nausea and vomiting), and Thyroid disease.   She had cataract surgery on the 9th and she reports that the eye doctor doing her surgery informed her that she needs to have an EKG prior to the second EKG due hearing a " skipped beat"   She denies chest pain, shortness of breath or feeling of palpitations.   Review of Systems See HPI   Past Medical History:  Diagnosis Date   Diverticulitis    History of kidney stones    Hyperlipidemia    Hypothyroidism    Kidney stones    Mitral valve prolapse    Ovarian cyst    PONV (postoperative nausea and vomiting)    Thyroid disease     Social History   Socioeconomic History   Marital status: Married    Spouse name: Not on file   Number of children: 3   Years of education: Not on file   Highest education level: Not on file  Occupational History   Occupation: housewife  Tobacco Use   Smoking status: Never Smoker   Smokeless tobacco: Never Used  Scientific laboratory technician Use: Never used  Substance and Sexual Activity   Alcohol use: No   Drug use: No   Sexual activity: Not on file  Other Topics Concern   Not on file  Social History Narrative   Not on file   Social Determinants of Health   Financial Resource Strain:    Difficulty of Paying Living Expenses:   Food Insecurity:    Worried About Charity fundraiser in the Last Year:    Arboriculturist in the Last Year:   Transportation Needs:    Film/video editor (Medical):    Lack of Transportation (Non-Medical):   Physical Activity:    Days of Exercise per Week:    Minutes of Exercise per Session:   Stress:    Feeling of Stress :   Social  Connections:    Frequency of Communication with Friends and Family:    Frequency of Social Gatherings with Friends and Family:    Attends Religious Services:    Active Member of Clubs or Organizations:    Attends Music therapist:    Marital Status:   Intimate Partner Violence:    Fear of Current or Ex-Partner:    Emotionally Abused:    Physically Abused:    Sexually Abused:     Past Surgical History:  Procedure Laterality Date   APPENDECTOMY     BREAST EXCISIONAL BIOPSY Right    COLONOSCOPY  2018   OVARIAN CYST REMOVAL     TONSILLECTOMY     TUBAL LIGATION      Family History  Problem Relation Age of Onset   COPD Father        smoker   Hyperlipidemia Father    Glaucoma Father    Heart disease Father    Breast cancer Sister    Colon cancer Neg Hx     Allergies  Allergen Reactions   Tizanidine Hcl Anaphylaxis   Sulfamethoxazole-Trimethoprim Rash and Other (See Comments)    fever,headache,chills    Current Outpatient Medications on File Prior to Visit  Medication  Sig Dispense Refill   aspirin 81 MG tablet Take 81 mg by mouth daily.       atorvastatin (LIPITOR) 40 MG tablet Take 1 tablet (40 mg total) by mouth daily. 90 tablet 0   b complex vitamins tablet Take 1 tablet by mouth daily.     Calcium Carbonate-Vit D-Min (CALCIUM 1200 PO) Take 1,200 mg by mouth daily.     Cholecalciferol (VITAMIN D) 2000 UNITS CAPS Take 2,000 Units by mouth daily.       CO-ENZYME Q-10 PO Take 100 mg by mouth daily.      diclofenac (VOLTAREN) 75 MG EC tablet Take 75 mg by mouth 2 (two) times daily.     dicyclomine (BENTYL) 10 MG capsule Take 10 mg by mouth daily as needed for spasms.      fenofibrate (TRICOR) 48 MG tablet Take 1 tablet (48 mg total) by mouth daily. 90 tablet 3   Flaxseed, Linseed, (FLAXSEED OIL) 1000 MG CAPS Take 1,000 mg by mouth daily.     hydroxypropyl methylcellulose / hypromellose (ISOPTO TEARS / GONIOVISC) 2.5 %  ophthalmic solution Place 1 drop into both eyes 4 (four) times daily as needed for dry eyes.     levothyroxine (SYNTHROID) 112 MCG tablet Take 1 tablet (112 mcg total) by mouth daily. 90 tablet 2   Lutein 20 MG CAPS Take 20 mg by mouth daily.     No current facility-administered medications on file prior to visit.    BP 120/80    Temp 98.1 F (36.7 C)    Wt 177 lb (80.3 kg)    BMI 27.93 kg/m       Objective:   Physical Exam Vitals and nursing note reviewed.  Constitutional:      Appearance: Normal appearance.  Cardiovascular:     Rate and Rhythm: Normal rate and regular rhythm. Frequent extrasystoles are present.    Pulses: Normal pulses.     Heart sounds: Normal heart sounds.  Pulmonary:     Effort: Pulmonary effort is normal.     Breath sounds: Normal breath sounds.  Skin:    General: Skin is warm and dry.  Neurological:     Mental Status: She is alert.  Psychiatric:        Mood and Affect: Mood normal.        Behavior: Behavior normal.        Thought Content: Thought content normal.        Judgment: Judgment normal.       Assessment & Plan:  1. Preoperative clearance  - EKG 12-Lead- Frequent PAC's. Rate 89.  - Consistent with previous EKGs.  - Ok for second cataract extraction   Dorothyann Peng, NP

## 2020-03-04 DIAGNOSIS — H25812 Combined forms of age-related cataract, left eye: Secondary | ICD-10-CM | POA: Diagnosis not present

## 2020-03-04 DIAGNOSIS — H2512 Age-related nuclear cataract, left eye: Secondary | ICD-10-CM | POA: Diagnosis not present

## 2020-03-31 ENCOUNTER — Telehealth: Payer: Self-pay | Admitting: Adult Health

## 2020-03-31 MED ORDER — ATORVASTATIN CALCIUM 40 MG PO TABS: 40.0000 mg | ORAL_TABLET | Freq: Every day | ORAL | 2 refills | Status: DC

## 2020-03-31 NOTE — Telephone Encounter (Signed)
Sent to the pharmacy by e-scribe. 

## 2020-03-31 NOTE — Telephone Encounter (Signed)
atorvastatin (LIPITOR) 40 MG tablet  [id number] [address], [place] - [place] Phone:  [phone]  Fax:  [phone]

## 2020-04-20 DIAGNOSIS — H43822 Vitreomacular adhesion, left eye: Secondary | ICD-10-CM | POA: Diagnosis not present

## 2020-06-03 ENCOUNTER — Ambulatory Visit: Payer: Medicare HMO

## 2020-06-08 ENCOUNTER — Ambulatory Visit (INDEPENDENT_AMBULATORY_CARE_PROVIDER_SITE_OTHER): Payer: Medicare HMO

## 2020-06-08 DIAGNOSIS — Z780 Asymptomatic menopausal state: Secondary | ICD-10-CM | POA: Diagnosis not present

## 2020-06-08 DIAGNOSIS — Z0000 Encounter for general adult medical examination without abnormal findings: Secondary | ICD-10-CM

## 2020-06-08 NOTE — Progress Notes (Signed)
Subjective:   Angela Houston is a 69 y.o. female who presents for an Initial Medicare Annual Wellness Visit.   I connected with Maite Burlison today by telephone and verified that I am speaking with the correct person using two identifiers. Location patient: home Location provider: work Persons participating in the virtual visit: patient, provider.   I discussed the limitations, risks, security and privacy concerns of performing an evaluation and management service by telephone and the availability of in person appointments. I also discussed with the patient that there may be a patient responsible charge related to this service. The patient expressed understanding and verbally consented to this telephonic visit.    Interactive audio and video telecommunications were attempted between this provider and patient, however failed, due to patient having technical difficulties OR patient did not have access to video capability.  We continued and completed visit with audio only.     Review of Systems    N/A Cardiac Risk Factors include: advanced age (>94men, >21 women)     Objective:    Today's Vitals   There is no height or weight on file to calculate BMI.  Advanced Directives 06/08/2020 09/13/2018  Does Patient Have a Medical Advance Directive? Yes Yes  Type of Paramedic of [place];Living will [place];Living will  Does patient want to make changes to medical advance directive? No - Patient declined -  Copy of [place] in Chart? No - copy requested No - copy requested    Current Medications (verified) Outpatient Encounter Medications as of 06/08/2020  Medication Sig  . aspirin 81 MG tablet Take 81 mg by mouth daily.    Marland Kitchen atorvastatin (LIPITOR) 40 MG tablet Take 1 tablet (40 mg total) by mouth daily.  Marland Kitchen b complex vitamins tablet Take 1 tablet by mouth daily.  . Calcium Carbonate-Vit D-Min (CALCIUM 1200 PO) Take 1,200 mg by  mouth daily.  . Cholecalciferol (VITAMIN D) 2000 UNITS CAPS Take 2,000 Units by mouth daily.    Marland Kitchen CO-ENZYME Q-10 PO Take 100 mg by mouth daily.   . diclofenac (VOLTAREN) 75 MG EC tablet Take 75 mg by mouth 2 (two) times daily.  Marland Kitchen dicyclomine (BENTYL) 10 MG capsule Take 10 mg by mouth daily as needed for spasms.   Marland Kitchen ELDERBERRY PO Take by mouth.  . fenofibrate (TRICOR) 48 MG tablet Take 1 tablet (48 mg total) by mouth daily.  . Flaxseed, Linseed, (FLAXSEED OIL) 1000 MG CAPS Take 1,000 mg by mouth daily.  . hydroxypropyl methylcellulose / hypromellose (ISOPTO TEARS / GONIOVISC) 2.5 % ophthalmic solution Place 1 drop into both eyes 4 (four) times daily as needed for dry eyes.  Marland Kitchen levothyroxine (SYNTHROID) 112 MCG tablet Take 1 tablet (112 mcg total) by mouth daily.  . Lutein 20 MG CAPS Take 20 mg by mouth daily.  . Probiotic Product (PROBIOTIC DAILY PO) Take by mouth.   No facility-administered encounter medications on file as of 06/08/2020.    Allergies (verified) Tizanidine hcl and Sulfamethoxazole-trimethoprim   History: Past Medical History:  Diagnosis Date  . Diverticulitis   . History of kidney stones   . Hyperlipidemia   . Hypothyroidism   . Kidney stones   . Mitral valve prolapse   . Ovarian cyst   . PONV (postoperative nausea and vomiting)   . Thyroid disease    Past Surgical History:  Procedure Laterality Date  . APPENDECTOMY    . BREAST EXCISIONAL BIOPSY Right   .  CATARACT EXTRACTION, BILATERAL    . COLONOSCOPY  2018  . OVARIAN CYST REMOVAL    . TONSILLECTOMY    . TUBAL LIGATION     Family History  Problem Relation Age of Onset  . COPD Father        smoker  . Hyperlipidemia Father   . Glaucoma Father   . Heart disease Father   . Breast cancer Sister   . Colon cancer Neg Hx    Social History   Socioeconomic History  . Marital status: Married    Spouse name: Not on file  . Number of children: 3  . Years of education: Not on file  . Highest education  level: Not on file  Occupational History  . Occupation: housewife  Tobacco Use  . Smoking status: Never Smoker  . Smokeless tobacco: Never Used  Vaping Use  . Vaping Use: Never used  Substance and Sexual Activity  . Alcohol use: No  . Drug use: No  . Sexual activity: Not on file  Other Topics Concern  . Not on file  Social History Narrative  . Not on file   Social Determinants of Health   Financial Resource Strain: Low Risk   . Difficulty of Paying Living Expenses: Not hard at all  Food Insecurity: No Food Insecurity  . Worried About Charity fundraiser in the Last Year: Never true  . Ran Out of Food in the Last Year: Never true  Transportation Needs: No Transportation Needs  . Lack of Transportation (Medical): No  . Lack of Transportation (Non-Medical): No  Physical Activity: Inactive  . Days of Exercise per Week: 0 days  . Minutes of Exercise per Session: 0 min  Stress: No Stress Concern Present  . Feeling of Stress : Not at all  Social Connections: Moderately Integrated  . Frequency of Communication with Friends and Family: More than three times a week  . Frequency of Social Gatherings with Friends and Family: Three times a week  . Attends Religious Services: More than 4 times per year  . Active Member of Clubs or Organizations: No  . Attends Archivist Meetings: Never  . Marital Status: Married    Tobacco Counseling Counseling given: Not Answered   Clinical Intake:  Pre-visit preparation completed: Yes  Pain : No/denies pain     Nutritional Risks: None Diabetes: No CBG done?: No Did pt. bring in CBG monitor from home?: No  How often do you need to have someone help you when you read instructions, pamphlets, or other written materials from your doctor or pharmacy?: 1 - Never What is the last grade level you completed in school?: 12th grade  Diabetic?No  Interpreter Needed?: No  Information entered by :: [place] of Daily  Living In your present state of health, do you have any difficulty performing the following activities: 06/08/2020  Hearing? N  Vision? N  Difficulty concentrating or making decisions? N  Walking or climbing stairs? N  Dressing or bathing? N  Doing errands, shopping? N  Preparing Food and eating ? N  Using the Toilet? N  In the past six months, have you accidently leaked urine? Y  Comment Has occassional issues of urne leakage with back pain  Do you have problems with loss of bowel control? N  Managing your Medications? N  Managing your Finances? N  Housekeeping or managing your Housekeeping? N  Some recent data might be hidden    Patient Care Team: Nafziger,  Tommi Rumps, NP as PCP - General (Family Medicine) Almedia Balls, MD as Referring Physician (Orthopedic Surgery) Misenheimer, Christia Reading, MD as Consulting Physician (Gastroenterology) Jovita Gamma, MD as Consulting Physician (Neurosurgery)  Indicate any recent Medical Services you may have received from other than [facility] providers in the past year (date may be approximate).     Assessment:   This is a routine wellness examination for Jailen.  Hearing/Vision screen  Hearing Screening   125Hz  250Hz  500Hz  1000Hz  2000Hz  3000Hz  4000Hz  6000Hz  8000Hz   Right ear:           Left ear:           Vision Screening Comments: Patient states gets eyes examined once yearly   Dietary issues and exercise activities discussed: Current Exercise Habits: The patient does not participate in regular exercise at present, Exercise limited by: orthopedic condition(s)  Goals    . Weight (lb) < 140 lb (63.5 kg)      Depression Screen PHQ 2/9 Scores 06/08/2020 10/04/2017 10/02/2016 09/27/2015  PHQ - 2 Score 0 0 0 0  PHQ- 9 Score 0 - - -    Fall Risk Fall Risk  06/08/2020 10/04/2017 10/02/2016 09/27/2015  Falls in the past year? - No No No  Number falls in past yr: 0 - - -  Injury with Fall? 0 - - -  Risk for fall due to : No Fall Risks - - -  Follow up  Falls evaluation completed;Falls prevention discussed - - -    Any stairs in or around the home? No  If so, are there any without handrails? No  Home free of loose throw rugs in walkways, pet beds, electrical cords, etc? Yes  Adequate lighting in your home to reduce risk of falls? Yes   ASSISTIVE DEVICES UTILIZED TO PREVENT FALLS:  Life alert? No  Use of a cane, walker or w/c? No  Grab bars in the bathroom? Yes  Shower chair or bench in shower? No  Elevated toilet seat or a handicapped toilet? No   Cognitive Function:     6CIT Screen 06/08/2020  What Year? 0 points  What month? 0 points  What time? 0 points  Count back from 20 0 points  Months in reverse 0 points  Repeat phrase 0 points  Total Score 0    Immunizations Immunization History  Administered Date(s) Administered  . Fluad Quad(high Dose 65+) 11/11/2019  . Influenza Split 06/04/2012  . Influenza Whole 07/26/2009  . Influenza, High Dose Seasonal PF 09/27/2015, 10/02/2016, 10/04/2017, 10/24/2018  . Influenza,inj,Quad PF,6+ Mos 07/07/2013, 07/08/2014  . Pneumococcal Conjugate-13 09/27/2015  . Pneumococcal Polysaccharide-23 10/02/2016  . Tdap 12/05/2010    TDAP status: Up to date Flu Vaccine status: Up to date Pneumococcal vaccine status: Up to date Covid-19 vaccine status: Declined, Education has been provided regarding the importance of this vaccine but patient still declined. Advised may receive this vaccine at local pharmacy or Health Dept.or vaccine clinic. Aware to provide a copy of the vaccination record if obtained from local pharmacy or Health Dept. Verbalized acceptance and understanding.  Qualifies for Shingles Vaccine? Yes   Zostavax completed No   Shingrix Completed?: No.    Education has been provided regarding the importance of this vaccine. Patient has been advised to call insurance company to determine out of pocket expense if they have not yet received this vaccine. Advised may also receive  vaccine at local pharmacy or Health Dept. Verbalized acceptance and understanding.  Screening Tests Health Maintenance  Topic  Date Due  . COVID-19 Vaccine (1) Never done  . INFLUENZA VACCINE  04/11/2020  . Hepatitis C Screening  11/10/2020 (Originally [birth year])  . TETANUS/TDAP  12/04/2020  . MAMMOGRAM  12/22/2021  . COLONOSCOPY  03/17/2025  . DEXA SCAN  Completed  . PNA vac Low Risk Adult  Completed    Health Maintenance  Health Maintenance Due  Topic Date Due  . COVID-19 Vaccine (1) Never done  . INFLUENZA VACCINE  04/11/2020    Colorectal cancer screening: Completed 12/05/2010. Repeat every 10 years Mammogram status: Completed 12/23/2019. Repeat every year Bone Density status: Completed 06/18/2013. Results reflect: Bone density results: OSTEOPENIA. Repeat every 2 years.  Lung Cancer Screening: (Low Dose CT Chest recommended if Age 71-80 years, 30 pack-year currently smoking OR have quit w/in 15years.) does not qualify.   Lung Cancer Screening Referral: N/A  Additional Screening:  Hepatitis C Screening: does qualify;   Vision Screening: Recommended annual ophthalmology exams for early detection of glaucoma and other disorders of the eye. Is the patient up to date with their annual eye exam?  Yes  Who is the provider or what is the name of the office in which the patient attends annual eye exams? [facility]  If pt is not established with a provider, would they like to be referred to a provider to establish care? No .   Dental Screening: Recommended annual dental exams for proper oral hygiene  Community Resource Referral / Chronic Care Management: CRR required this visit?  No   CCM required this visit?  No      Plan:     I have personally reviewed and noted the following in the patient's chart:   . Medical and social history . Use of alcohol, tobacco or illicit drugs  . Current medications and supplements . Functional ability and  status . Nutritional status . Physical activity . Advanced directives . List of other physicians . Hospitalizations, surgeries, and ER visits in previous 12 months . Vitals . Screenings to include cognitive, depression, and falls . Referrals and appointments  In addition, I have reviewed and discussed with patient certain preventive protocols, quality metrics, and best practice recommendations. A written personalized care plan for preventive services as well as general preventive health recommendations were provided to patient.     Ofilia Neas, LPN   [id number]/51/[id number]   Nurse Notes: None

## 2020-06-08 NOTE — Patient Instructions (Signed)
Angela Houston , Thank you for taking time to come for your Medicare Wellness Visit. I appreciate your ongoing commitment to your health goals. Please review the following plan we discussed and let me know if I can assist you in the future.   Screening recommendations/referrals: Colonoscopy: Up to date, next due 12/04/2020 Mammogram: Up to date, next due 12/22/2020 Bone Density: Currently due, orders placed this visit Recommended yearly ophthalmology/optometry visit for glaucoma screening and checkup Recommended yearly dental visit for hygiene and checkup  Vaccinations: Influenza vaccine: Currently due, you may receive in our office or at your local pharmacy  Pneumococcal vaccine: Completed series Tdap vaccine: Up to date, next due 12/04/2020 Shingles vaccine: Currently due for shingrix, please contact your pharmacy to discuss cost and to receive vaccines    Advanced directives: Please bring copies of your medical advanced directives into the office so that we may scan into your chart.  Conditions/risks identified: Please try to incorporate at least 150 minutes of moderate exercise into your weekly routine   Next appointment: None    Preventive Care 65 Years and Older, Female Preventive care refers to lifestyle choices and visits with your health care provider that can promote health and wellness. What does preventive care include?  A yearly physical exam. This is also called an annual well check.  Dental exams once or twice a year.  Routine eye exams. Ask your health care provider how often you should have your eyes checked.  Personal lifestyle choices, including:  Daily care of your teeth and gums.  Regular physical activity.  Eating a healthy diet.  Avoiding tobacco and drug use.  Limiting alcohol use.  Practicing safe sex.  Taking low-dose aspirin every day.  Taking vitamin and mineral supplements as recommended by your health care provider. What happens during an  annual well check? The services and screenings done by your health care provider during your annual well check will depend on your age, overall health, lifestyle risk factors, and family history of disease. Counseling  Your health care provider may ask you questions about your:  Alcohol use.  Tobacco use.  Drug use.  Emotional well-being.  Home and relationship well-being.  Sexual activity.  Eating habits.  History of falls.  Memory and ability to understand (cognition).  Work and work Statistician.  Reproductive health. Screening  You may have the following tests or measurements:  Height, weight, and BMI.  Blood pressure.  Lipid and cholesterol levels. These may be checked every 5 years, or more frequently if you are over 66 years old.  Skin check.  Lung cancer screening. You may have this screening every year starting at age 23 if you have a 30-pack-year history of smoking and currently smoke or have quit within the past 15 years.  Fecal occult blood test (FOBT) of the stool. You may have this test every year starting at age 13.  Flexible sigmoidoscopy or colonoscopy. You may have a sigmoidoscopy every 5 years or a colonoscopy every 10 years starting at age 88.  Hepatitis C blood test.  Hepatitis B blood test.  Sexually transmitted disease (STD) testing.  Diabetes screening. This is done by checking your blood sugar (glucose) after you have not eaten for a while (fasting). You may have this done every 1-3 years.  Bone density scan. This is done to screen for osteoporosis. You may have this done starting at age 53.  Mammogram. This may be done every 1-2 years. Talk to your health care provider about  how often you should have regular mammograms. Talk with your health care provider about your test results, treatment options, and if necessary, the need for more tests. Vaccines  Your health care provider may recommend certain vaccines, such as:  Influenza  vaccine. This is recommended every year.  Tetanus, diphtheria, and acellular pertussis (Tdap, Td) vaccine. You may need a Td booster every 10 years.  Zoster vaccine. You may need this after age 45.  Pneumococcal 13-valent conjugate (PCV13) vaccine. One dose is recommended after age 72.  Pneumococcal polysaccharide (PPSV23) vaccine. One dose is recommended after age 62. Talk to your health care provider about which screenings and vaccines you need and how often you need them. This information is not intended to replace advice given to you by your health care provider. Make sure you discuss any questions you have with your health care provider. Document Released: 09/24/2015 Document Revised: 05/17/2016 Document Reviewed: 06/29/2015 Elsevier Interactive Patient Education  2017 [place] Prevention in the Home Falls can cause injuries. They can happen to people of all ages. There are many things you can do to make your home safe and to help prevent falls. What can I do on the outside of my home?  Regularly fix the edges of walkways and driveways and fix any cracks.  Remove anything that might make you trip as you walk through a door, such as a raised step or threshold.  Trim any bushes or trees on the path to your home.  Use bright outdoor lighting.  Clear any walking paths of anything that might make someone trip, such as rocks or tools.  Regularly check to see if handrails are loose or broken. Make sure that both sides of any steps have handrails.  Any raised decks and porches should have guardrails on the edges.  Have any leaves, snow, or ice cleared regularly.  Use sand or salt on walking paths during winter.  Clean up any spills in your garage right away. This includes oil or grease spills. What can I do in the bathroom?  Use night lights.  Install grab bars by the toilet and in the tub and shower. Do not use towel bars as grab bars.  Use non-skid mats or decals  in the tub or shower.  If you need to sit down in the shower, use a plastic, non-slip stool.  Keep the floor dry. Clean up any water that spills on the floor as soon as it happens.  Remove soap buildup in the tub or shower regularly.  Attach bath mats securely with double-sided non-slip rug tape.  Do not have throw rugs and other things on the floor that can make you trip. What can I do in the bedroom?  Use night lights.  Make sure that you have a light by your bed that is easy to reach.  Do not use any sheets or blankets that are too big for your bed. They should not hang down onto the floor.  Have a firm chair that has side arms. You can use this for support while you get dressed.  Do not have throw rugs and other things on the floor that can make you trip. What can I do in the kitchen?  Clean up any spills right away.  Avoid walking on wet floors.  Keep items that you use a lot in easy-to-reach places.  If you need to reach something above you, use a strong step stool that has a grab bar.  Keep  electrical cords out of the way.  Do not use floor polish or wax that makes floors slippery. If you must use wax, use non-skid floor wax.  Do not have throw rugs and other things on the floor that can make you trip. What can I do with my stairs?  Do not leave any items on the stairs.  Make sure that there are handrails on both sides of the stairs and use them. Fix handrails that are broken or loose. Make sure that handrails are as long as the stairways.  Check any carpeting to make sure that it is firmly attached to the stairs. Fix any carpet that is loose or worn.  Avoid having throw rugs at the top or bottom of the stairs. If you do have throw rugs, attach them to the floor with carpet tape.  Make sure that you have a light switch at the top of the stairs and the bottom of the stairs. If you do not have them, ask someone to add them for you. What else can I do to help  prevent falls?  Wear shoes that:  Do not have high heels.  Have rubber bottoms.  Are comfortable and fit you well.  Are closed at the toe. Do not wear sandals.  If you use a stepladder:  Make sure that it is fully opened. Do not climb a closed stepladder.  Make sure that both sides of the stepladder are locked into place.  Ask someone to hold it for you, if possible.  Clearly mark and make sure that you can see:  Any grab bars or handrails.  First and last steps.  Where the edge of each step is.  Use tools that help you move around (mobility aids) if they are needed. These include:  Canes.  Walkers.  Scooters.  Crutches.  Turn on the lights when you go into a dark area. Replace any light bulbs as soon as they burn out.  Set up your furniture so you have a clear path. Avoid moving your furniture around.  If any of your floors are uneven, fix them.  If there are any pets around you, be aware of where they are.  Review your medicines with your doctor. Some medicines can make you feel dizzy. This can increase your chance of falling. Ask your doctor what other things that you can do to help prevent falls. This information is not intended to replace advice given to you by your health care provider. Make sure you discuss any questions you have with your health care provider. Document Released: 06/24/2009 Document Revised: 02/03/2016 Document Reviewed: 10/02/2014 Elsevier Interactive Patient Education  2017 [employer].

## 2020-06-15 DIAGNOSIS — Z961 Presence of intraocular lens: Secondary | ICD-10-CM | POA: Diagnosis not present

## 2020-06-15 DIAGNOSIS — H43822 Vitreomacular adhesion, left eye: Secondary | ICD-10-CM | POA: Diagnosis not present

## 2020-07-13 DIAGNOSIS — I951 Orthostatic hypotension: Secondary | ICD-10-CM | POA: Diagnosis not present

## 2020-07-13 DIAGNOSIS — E039 Hypothyroidism, unspecified: Secondary | ICD-10-CM | POA: Diagnosis not present

## 2020-07-13 DIAGNOSIS — G8929 Other chronic pain: Secondary | ICD-10-CM | POA: Diagnosis not present

## 2020-07-13 DIAGNOSIS — Z6829 Body mass index (BMI) 29.0-29.9, adult: Secondary | ICD-10-CM | POA: Diagnosis not present

## 2020-07-13 DIAGNOSIS — Z803 Family history of malignant neoplasm of breast: Secondary | ICD-10-CM | POA: Diagnosis not present

## 2020-07-13 DIAGNOSIS — M4800 Spinal stenosis, site unspecified: Secondary | ICD-10-CM | POA: Diagnosis not present

## 2020-07-13 DIAGNOSIS — E785 Hyperlipidemia, unspecified: Secondary | ICD-10-CM | POA: Diagnosis not present

## 2020-07-13 DIAGNOSIS — R030 Elevated blood-pressure reading, without diagnosis of hypertension: Secondary | ICD-10-CM | POA: Diagnosis not present

## 2020-07-13 DIAGNOSIS — Z8249 Family history of ischemic heart disease and other diseases of the circulatory system: Secondary | ICD-10-CM | POA: Diagnosis not present

## 2020-07-13 DIAGNOSIS — E663 Overweight: Secondary | ICD-10-CM | POA: Diagnosis not present

## 2020-08-21 DIAGNOSIS — H52223 Regular astigmatism, bilateral: Secondary | ICD-10-CM | POA: Diagnosis not present

## 2020-08-21 DIAGNOSIS — H524 Presbyopia: Secondary | ICD-10-CM | POA: Diagnosis not present

## 2020-09-20 DIAGNOSIS — M5416 Radiculopathy, lumbar region: Secondary | ICD-10-CM | POA: Diagnosis not present

## 2020-09-20 DIAGNOSIS — M4316 Spondylolisthesis, lumbar region: Secondary | ICD-10-CM | POA: Diagnosis not present

## 2020-09-20 DIAGNOSIS — R030 Elevated blood-pressure reading, without diagnosis of hypertension: Secondary | ICD-10-CM | POA: Diagnosis not present

## 2020-09-20 DIAGNOSIS — M5136 Other intervertebral disc degeneration, lumbar region: Secondary | ICD-10-CM | POA: Diagnosis not present

## 2020-09-20 DIAGNOSIS — M47816 Spondylosis without myelopathy or radiculopathy, lumbar region: Secondary | ICD-10-CM | POA: Diagnosis not present

## 2020-09-20 DIAGNOSIS — Z6827 Body mass index (BMI) 27.0-27.9, adult: Secondary | ICD-10-CM | POA: Diagnosis not present

## 2020-09-30 ENCOUNTER — Other Ambulatory Visit: Payer: Self-pay | Admitting: Adult Health

## 2020-09-30 DIAGNOSIS — M8580 Other specified disorders of bone density and structure, unspecified site: Secondary | ICD-10-CM

## 2020-10-05 ENCOUNTER — Ambulatory Visit
Admission: RE | Admit: 2020-10-05 | Discharge: 2020-10-05 | Disposition: A | Discharge status: Home / Self Care | Payer: Medicare HMO | Source: Ambulatory Visit | Attending: Adult Health | Admitting: Adult Health

## 2020-10-05 ENCOUNTER — Other Ambulatory Visit: Payer: Self-pay

## 2020-10-05 DIAGNOSIS — M8589 Other specified disorders of bone density and structure, multiple sites: Secondary | ICD-10-CM | POA: Diagnosis not present

## 2020-10-05 DIAGNOSIS — Z780 Asymptomatic menopausal state: Secondary | ICD-10-CM | POA: Diagnosis not present

## 2020-10-05 DIAGNOSIS — M8580 Other specified disorders of bone density and structure, unspecified site: Secondary | ICD-10-CM

## 2020-10-20 DIAGNOSIS — M5136 Other intervertebral disc degeneration, lumbar region: Secondary | ICD-10-CM | POA: Diagnosis not present

## 2020-10-20 DIAGNOSIS — Z6826 Body mass index (BMI) 26.0-26.9, adult: Secondary | ICD-10-CM | POA: Diagnosis not present

## 2020-10-20 DIAGNOSIS — M545 Low back pain, unspecified: Secondary | ICD-10-CM | POA: Diagnosis not present

## 2020-10-20 DIAGNOSIS — M5416 Radiculopathy, lumbar region: Secondary | ICD-10-CM | POA: Diagnosis not present

## 2020-11-09 ENCOUNTER — Other Ambulatory Visit: Payer: Self-pay | Admitting: Adult Health

## 2020-11-09 DIAGNOSIS — M5416 Radiculopathy, lumbar region: Secondary | ICD-10-CM | POA: Diagnosis not present

## 2020-11-09 DIAGNOSIS — E038 Other specified hypothyroidism: Secondary | ICD-10-CM

## 2020-11-18 ENCOUNTER — Other Ambulatory Visit: Payer: Self-pay

## 2020-11-19 ENCOUNTER — Ambulatory Visit (INDEPENDENT_AMBULATORY_CARE_PROVIDER_SITE_OTHER): Payer: Medicare HMO | Admitting: Adult Health

## 2020-11-19 ENCOUNTER — Encounter: Payer: Self-pay | Admitting: Adult Health

## 2020-11-19 VITALS — BP 128/78 | Temp 98.6°F | Wt 171.0 lb

## 2020-11-19 DIAGNOSIS — E782 Mixed hyperlipidemia: Secondary | ICD-10-CM | POA: Diagnosis not present

## 2020-11-19 DIAGNOSIS — E039 Hypothyroidism, unspecified: Secondary | ICD-10-CM | POA: Diagnosis not present

## 2020-11-19 DIAGNOSIS — Z0000 Encounter for general adult medical examination without abnormal findings: Secondary | ICD-10-CM

## 2020-11-19 DIAGNOSIS — Z1159 Encounter for screening for other viral diseases: Secondary | ICD-10-CM

## 2020-11-19 LAB — CBC WITH DIFFERENTIAL/PLATELET
Basophils Absolute: 0.1 10*3/uL (ref 0.0–0.1)
Basophils Relative: 0.9 % (ref 0.0–3.0)
Eosinophils Absolute: 0.3 10*3/uL (ref 0.0–0.7)
Eosinophils Relative: 4 % (ref 0.0–5.0)
HCT: 41.9 % (ref 36.0–46.0)
Hemoglobin: 14.2 g/dL (ref 12.0–15.0)
Lymphocytes Relative: 23.5 % (ref 12.0–46.0)
Lymphs Abs: 1.5 10*3/uL (ref 0.7–4.0)
MCHC: 34 g/dL (ref 30.0–36.0)
MCV: 90.8 fl (ref 78.0–100.0)
Monocytes Absolute: 0.5 10*3/uL (ref 0.1–1.0)
Monocytes Relative: 8.4 % (ref 3.0–12.0)
Neutro Abs: 4.1 10*3/uL (ref 1.4–7.7)
Neutrophils Relative %: 63.2 % (ref 43.0–77.0)
Platelets: 309 10*3/uL (ref 150.0–400.0)
RBC: 4.62 Mil/uL (ref 3.87–5.11)
RDW: 13.3 % (ref 11.5–15.5)
WBC: 6.5 10*3/uL (ref 4.0–10.5)

## 2020-11-19 LAB — COMPREHENSIVE METABOLIC PANEL
ALT: 51 U/L — ABNORMAL HIGH (ref 0–35)
AST: 43 U/L — ABNORMAL HIGH (ref 0–37)
Albumin: 4.2 g/dL (ref 3.5–5.2)
Alkaline Phosphatase: 97 U/L (ref 39–117)
BUN: 20 mg/dL (ref 6–23)
CO2: 28 mEq/L (ref 19–32)
Calcium: 9.7 mg/dL (ref 8.4–10.5)
Chloride: 104 mEq/L (ref 96–112)
Creatinine, Ser: 0.81 mg/dL (ref 0.40–1.20)
GFR: 73.68 mL/min (ref >60.00)
Glucose, Bld: 102 mg/dL — ABNORMAL HIGH (ref 70–99)
Potassium: 4.2 mEq/L (ref 3.5–5.1)
Sodium: 140 mEq/L (ref 135–145)
Total Bilirubin: 0.7 mg/dL (ref 0.2–1.2)
Total Protein: 6.6 g/dL (ref 6.0–8.3)

## 2020-11-19 LAB — LIPID PANEL
Cholesterol: 191 mg/dL (ref 0–200)
HDL: 59.4 mg/dL (ref >39.00)
LDL Cholesterol: 106 mg/dL — ABNORMAL HIGH (ref 0–99)
NonHDL: 131.53
Total CHOL/HDL Ratio: 3
Triglycerides: 129 mg/dL (ref 0.0–149.0)
VLDL: 25.8 mg/dL (ref 0.0–40.0)

## 2020-11-19 LAB — TSH: TSH: 0.96 u[IU]/mL (ref 0.35–4.50)

## 2020-11-19 NOTE — Progress Notes (Signed)
Subjective:    Patient ID: Angela Houston, female    DOB: 05/06/1951, 70 y.o.   MRN: [id number]  HPI Patient presents for yearly preventative medicine examination. She is a pleasant 70 year old female who  has a past medical history of Diverticulitis, History of kidney stones, Hyperlipidemia, Hypothyroidism, Kidney stones, Mitral valve prolapse, Ovarian cyst, PONV (postoperative nausea and vomiting), and Thyroid disease.  Hypothyroidism-takes Synthroid 112 mcg daily.  She does feel cold trolled on this medication Lab Results  Component Value Date   TSH 1.29 02/18/2020   Hyperlipidemia-takes Lipitor 40 mg, Tricor 48 mg  aspirin 81 mg daily.  She denies myalgia or fatigue Lab Results  Component Value Date   CHOL 189 02/18/2020   HDL 53.80 02/18/2020   LDLCALC 102 (H) 07/01/2014   LDLDIRECT 101.0 02/18/2020   TRIG 227.0 (H) 02/18/2020   CHOLHDL 4 02/18/2020   Lumbar Radiculopathy - Is being seen by [facility]. Had an injection recently but this only last a a day and half. She has a follow up appointment in two weeks for next steps.   All immunizations and health maintenance protocols were reviewed with the patient and needed orders were placed.  Appropriate screening laboratory values were ordered for the patient including screening of hyperlipidemia, renal function and hepatic function.  Medication reconciliation,  past medical history, social history, problem list and allergies were reviewed in detail with the patient  Goals were established with regard to weight loss, exercise, and  diet in compliance with medications.   Wt Readings from Last 3 Encounters:  11/19/20 171 lb (77.6 kg)  02/27/20 177 lb (80.3 kg)  11/11/19 181 lb (82.1 kg)   She is up-to-date on routine colon cancer screening. She is due in April for her Mammogram. Up to date on Bone Density screens    Review of Systems  Constitutional: Negative.   HENT: Negative.   Eyes: Negative.    Respiratory: Negative.   Cardiovascular: Negative.   Gastrointestinal: Negative.   Endocrine: Negative.   Genitourinary: Negative.   Musculoskeletal: Positive for arthralgias and back pain.  Skin: Negative.   Allergic/Immunologic: Negative.   Neurological: Negative.   Hematological: Negative.   Psychiatric/Behavioral: Negative.    Past Medical History:  Diagnosis Date  . Diverticulitis   . History of kidney stones   . Hyperlipidemia   . Hypothyroidism   . Kidney stones   . Mitral valve prolapse   . Ovarian cyst   . PONV (postoperative nausea and vomiting)   . Thyroid disease     Social History   Socioeconomic History  . Marital status: Married    Spouse name: Not on file  . Number of children: 3  . Years of education: Not on file  . Highest education level: Not on file  Occupational History  . Occupation: housewife  Tobacco Use  . Smoking status: Never Smoker  . Smokeless tobacco: Never Used  Vaping Use  . Vaping Use: Never used  Substance and Sexual Activity  . Alcohol use: No  . Drug use: No  . Sexual activity: Not on file  Other Topics Concern  . Not on file  Social History Narrative  . Not on file   Social Determinants of Health   Financial Resource Strain: Low Risk   . Difficulty of Paying Living Expenses: Not hard at all  Food Insecurity: No Food Insecurity  . Worried About Charity fundraiser in the Last Year: Never true  .  Ran Out of Food in the Last Year: Never true  Transportation Needs: No Transportation Needs  . Lack of Transportation (Medical): No  . Lack of Transportation (Non-Medical): No  Physical Activity: Inactive  . Days of Exercise per Week: 0 days  . Minutes of Exercise per Session: 0 min  Stress: No Stress Concern Present  . Feeling of Stress : Not at all  Social Connections: Moderately Integrated  . Frequency of Communication with Friends and Family: More than three times a week  . Frequency of Social Gatherings with Friends  and Family: Three times a week  . Attends Religious Services: More than 4 times per year  . Active Member of Clubs or Organizations: No  . Attends Archivist Meetings: Never  . Marital Status: Married  Human resources officer Violence: Not At Risk  . Fear of Current or Ex-Partner: No  . Emotionally Abused: No  . Physically Abused: No  . Sexually Abused: No    Past Surgical History:  Procedure Laterality Date  . APPENDECTOMY    . BREAST EXCISIONAL BIOPSY Right   . CATARACT EXTRACTION, BILATERAL    . COLONOSCOPY  2018  . OVARIAN CYST REMOVAL    . TONSILLECTOMY    . TUBAL LIGATION      Family History  Problem Relation Age of Onset  . COPD Father        smoker  . Hyperlipidemia Father   . Glaucoma Father   . Heart disease Father   . Breast cancer Sister   . Colon cancer Neg Hx     Allergies  Allergen Reactions  . Tizanidine Hcl Anaphylaxis  . Sulfamethoxazole-Trimethoprim Rash and Other (See Comments)    fever,headache,chills    Current Outpatient Medications on File Prior to Visit  Medication Sig Dispense Refill  . atorvastatin (LIPITOR) 40 MG tablet Take 1 tablet (40 mg total) by mouth daily. 90 tablet 2  . b complex vitamins tablet Take 1 tablet by mouth daily.    . Calcium Carbonate-Vit D-Min (CALCIUM 1200 PO) Take 1,200 mg by mouth daily.    . Cholecalciferol (VITAMIN D) 2000 UNITS CAPS Take 2,000 Units by mouth daily.      Marland Kitchen CO-ENZYME Q-10 PO Take 100 mg by mouth daily.     . diclofenac (VOLTAREN) 75 MG EC tablet Take 75 mg by mouth 2 (two) times daily.    Marland Kitchen dicyclomine (BENTYL) 10 MG capsule Take 10 mg by mouth daily as needed for spasms.     Marland Kitchen ELDERBERRY PO Take by mouth.    . fenofibrate (TRICOR) 48 MG tablet Take 1 tablet (48 mg total) by mouth daily. 90 tablet 3  . Flaxseed, Linseed, (FLAXSEED OIL) 1000 MG CAPS Take 1,000 mg by mouth daily.    . hydroxypropyl methylcellulose / hypromellose (ISOPTO TEARS / GONIOVISC) 2.5 % ophthalmic solution Place 1 drop  into both eyes 4 (four) times daily as needed for dry eyes.    Marland Kitchen levothyroxine (SYNTHROID) 112 MCG tablet TAKE 1 TABLET BY MOUTH EVERY DAY 90 tablet 0  . Lutein 20 MG CAPS Take 20 mg by mouth daily.    . Probiotic Product (PROBIOTIC DAILY PO) Take by mouth.     No current facility-administered medications on file prior to visit.    BP 128/78   Temp 98.6 F (37 C)   Wt 171 lb (77.6 kg)   BMI 26.98 kg/m       Objective:   Physical Exam Vitals and nursing note reviewed.  Constitutional:      General: She is not in acute distress.    Appearance: Normal appearance. She is well-developed. She is not ill-appearing.  HENT:     Head: Normocephalic and atraumatic.     Right Ear: Tympanic membrane, ear canal and external ear normal. There is no impacted cerumen.     Left Ear: Tympanic membrane, ear canal and external ear normal. There is no impacted cerumen.     Nose: Nose normal. No congestion or rhinorrhea.     Mouth/Throat:     Mouth: Mucous membranes are moist.     Pharynx: Oropharynx is clear. No oropharyngeal exudate or posterior oropharyngeal erythema.  Eyes:     General:        Right eye: No discharge.        Left eye: No discharge.     Extraocular Movements: Extraocular movements intact.     Conjunctiva/sclera: Conjunctivae normal.     Pupils: Pupils are equal, round, and reactive to light.  Neck:     Thyroid: No thyromegaly.     Vascular: No carotid bruit.     Trachea: No tracheal deviation.  Cardiovascular:     Rate and Rhythm: Normal rate and regular rhythm.     Pulses: Normal pulses.     Heart sounds: Normal heart sounds. No murmur heard. No friction rub. No gallop.   Pulmonary:     Effort: Pulmonary effort is normal. No respiratory distress.     Breath sounds: Normal breath sounds. No stridor. No wheezing, rhonchi or rales.  Chest:     Chest wall: No tenderness.  Abdominal:     General: Abdomen is flat. Bowel sounds are normal. There is no distension.      Palpations: Abdomen is soft. There is no mass.     Tenderness: There is no abdominal tenderness. There is no right CVA tenderness, left CVA tenderness, guarding or rebound.     Hernia: No hernia is present.  Musculoskeletal:        General: No swelling, tenderness, deformity or signs of injury. Normal range of motion.     Cervical back: Normal range of motion and neck supple.     Right lower leg: No edema.     Left lower leg: No edema.     Comments: Lumbar spine pain with limited ROM   Lymphadenopathy:     Cervical: No cervical adenopathy.  Skin:    General: Skin is warm and dry.     Coloration: Skin is not jaundiced or pale.     Findings: No bruising, erythema, lesion or rash.  Neurological:     General: No focal deficit present.     Mental Status: She is alert and oriented to person, place, and time.     Cranial Nerves: No cranial nerve deficit.     Sensory: No sensory deficit.     Motor: No weakness.     Coordination: Coordination normal.     Gait: Gait normal.     Deep Tendon Reflexes: Reflexes normal.  Psychiatric:        Mood and Affect: Mood normal.        Behavior: Behavior normal.        Thought Content: Thought content normal.        Judgment: Judgment normal.       Assessment & Plan:  1. Routine general medical examination at a health care facility  Follow up in one year or sooner if needed - CBC with  Differential/Platelet; Future - Comprehensive metabolic panel; Future - Lipid panel; Future - TSH; Future  2. Hypothyroidism, unspecified type - Consider increase in synthroid  - CBC with Differential/Platelet; Future - Comprehensive metabolic panel; Future - Lipid panel; Future - TSH; Future  3. Mixed hyperlipidemia - Consider increase in statin  - CBC with Differential/Platelet; Future - Comprehensive metabolic panel; Future - Lipid panel; Future - TSH; Future  4. Need for hepatitis C screening test  - Hep C Antibody; Future  Dorothyann Peng, NP

## 2020-11-19 NOTE — Patient Instructions (Signed)
It was great seeing you today   We will follow up with you regarding your blood work   Please let me know if you need anything    

## 2020-11-19 NOTE — Addendum Note (Signed)
Addended by: Tessie Fass D on: 11/19/2020 08:31 AM   Modules accepted: Orders

## 2020-11-22 LAB — HEPATITIS C ANTIBODY
Hepatitis C Ab: NONREACTIVE
SIGNAL TO CUT-OFF: 0.02 (ref <1.00)

## 2020-11-29 DIAGNOSIS — Z6826 Body mass index (BMI) 26.0-26.9, adult: Secondary | ICD-10-CM | POA: Diagnosis not present

## 2020-11-29 DIAGNOSIS — M5416 Radiculopathy, lumbar region: Secondary | ICD-10-CM | POA: Diagnosis not present

## 2020-12-26 ENCOUNTER — Other Ambulatory Visit: Payer: Self-pay | Admitting: Adult Health

## 2020-12-30 ENCOUNTER — Other Ambulatory Visit: Payer: Self-pay | Admitting: Adult Health

## 2020-12-30 DIAGNOSIS — Z1231 Encounter for screening mammogram for malignant neoplasm of breast: Secondary | ICD-10-CM

## 2021-01-11 DIAGNOSIS — H43822 Vitreomacular adhesion, left eye: Secondary | ICD-10-CM | POA: Diagnosis not present

## 2021-01-11 DIAGNOSIS — H26491 Other secondary cataract, right eye: Secondary | ICD-10-CM | POA: Diagnosis not present

## 2021-01-11 DIAGNOSIS — H43813 Vitreous degeneration, bilateral: Secondary | ICD-10-CM | POA: Diagnosis not present

## 2021-01-11 DIAGNOSIS — H524 Presbyopia: Secondary | ICD-10-CM | POA: Diagnosis not present

## 2021-01-31 DIAGNOSIS — M5136 Other intervertebral disc degeneration, lumbar region: Secondary | ICD-10-CM | POA: Diagnosis not present

## 2021-01-31 DIAGNOSIS — M5416 Radiculopathy, lumbar region: Secondary | ICD-10-CM | POA: Diagnosis not present

## 2021-02-01 DIAGNOSIS — H26491 Other secondary cataract, right eye: Secondary | ICD-10-CM | POA: Diagnosis not present

## 2021-02-01 DIAGNOSIS — H43812 Vitreous degeneration, left eye: Secondary | ICD-10-CM | POA: Diagnosis not present

## 2021-02-07 ENCOUNTER — Other Ambulatory Visit: Payer: Self-pay | Admitting: Adult Health

## 2021-02-07 DIAGNOSIS — E038 Other specified hypothyroidism: Secondary | ICD-10-CM

## 2021-02-08 DIAGNOSIS — M9902 Segmental and somatic dysfunction of thoracic region: Secondary | ICD-10-CM | POA: Diagnosis not present

## 2021-02-08 DIAGNOSIS — M9903 Segmental and somatic dysfunction of lumbar region: Secondary | ICD-10-CM | POA: Diagnosis not present

## 2021-02-08 DIAGNOSIS — M9901 Segmental and somatic dysfunction of cervical region: Secondary | ICD-10-CM | POA: Diagnosis not present

## 2021-02-08 DIAGNOSIS — M9904 Segmental and somatic dysfunction of sacral region: Secondary | ICD-10-CM | POA: Diagnosis not present

## 2021-02-10 DIAGNOSIS — M9901 Segmental and somatic dysfunction of cervical region: Secondary | ICD-10-CM | POA: Diagnosis not present

## 2021-02-10 DIAGNOSIS — M9903 Segmental and somatic dysfunction of lumbar region: Secondary | ICD-10-CM | POA: Diagnosis not present

## 2021-02-10 DIAGNOSIS — M9904 Segmental and somatic dysfunction of sacral region: Secondary | ICD-10-CM | POA: Diagnosis not present

## 2021-02-10 DIAGNOSIS — M9902 Segmental and somatic dysfunction of thoracic region: Secondary | ICD-10-CM | POA: Diagnosis not present

## 2021-02-15 DIAGNOSIS — M9902 Segmental and somatic dysfunction of thoracic region: Secondary | ICD-10-CM | POA: Diagnosis not present

## 2021-02-15 DIAGNOSIS — M9904 Segmental and somatic dysfunction of sacral region: Secondary | ICD-10-CM | POA: Diagnosis not present

## 2021-02-15 DIAGNOSIS — M9903 Segmental and somatic dysfunction of lumbar region: Secondary | ICD-10-CM | POA: Diagnosis not present

## 2021-02-15 DIAGNOSIS — M9901 Segmental and somatic dysfunction of cervical region: Secondary | ICD-10-CM | POA: Diagnosis not present

## 2021-02-18 ENCOUNTER — Other Ambulatory Visit: Payer: Self-pay

## 2021-02-18 ENCOUNTER — Ambulatory Visit
Admission: RE | Admit: 2021-02-18 | Discharge: 2021-02-18 | Disposition: A | Discharge status: Home / Self Care | Payer: Medicare HMO | Source: Ambulatory Visit | Attending: Adult Health | Admitting: Adult Health

## 2021-02-18 DIAGNOSIS — M9902 Segmental and somatic dysfunction of thoracic region: Secondary | ICD-10-CM | POA: Diagnosis not present

## 2021-02-18 DIAGNOSIS — M9904 Segmental and somatic dysfunction of sacral region: Secondary | ICD-10-CM | POA: Diagnosis not present

## 2021-02-18 DIAGNOSIS — Z1231 Encounter for screening mammogram for malignant neoplasm of breast: Secondary | ICD-10-CM | POA: Diagnosis not present

## 2021-02-18 DIAGNOSIS — M9901 Segmental and somatic dysfunction of cervical region: Secondary | ICD-10-CM | POA: Diagnosis not present

## 2021-02-18 DIAGNOSIS — M9903 Segmental and somatic dysfunction of lumbar region: Secondary | ICD-10-CM | POA: Diagnosis not present

## 2021-02-18 LAB — HM MAMMOGRAPHY

## 2021-02-21 ENCOUNTER — Encounter: Payer: Self-pay | Admitting: Adult Health

## 2021-02-22 DIAGNOSIS — M9903 Segmental and somatic dysfunction of lumbar region: Secondary | ICD-10-CM | POA: Diagnosis not present

## 2021-02-22 DIAGNOSIS — M9902 Segmental and somatic dysfunction of thoracic region: Secondary | ICD-10-CM | POA: Diagnosis not present

## 2021-02-22 DIAGNOSIS — M9904 Segmental and somatic dysfunction of sacral region: Secondary | ICD-10-CM | POA: Diagnosis not present

## 2021-02-22 DIAGNOSIS — M9901 Segmental and somatic dysfunction of cervical region: Secondary | ICD-10-CM | POA: Diagnosis not present

## 2021-02-25 DIAGNOSIS — M9901 Segmental and somatic dysfunction of cervical region: Secondary | ICD-10-CM | POA: Diagnosis not present

## 2021-02-25 DIAGNOSIS — M9904 Segmental and somatic dysfunction of sacral region: Secondary | ICD-10-CM | POA: Diagnosis not present

## 2021-02-25 DIAGNOSIS — M9903 Segmental and somatic dysfunction of lumbar region: Secondary | ICD-10-CM | POA: Diagnosis not present

## 2021-02-25 DIAGNOSIS — M9902 Segmental and somatic dysfunction of thoracic region: Secondary | ICD-10-CM | POA: Diagnosis not present

## 2021-03-01 DIAGNOSIS — M9903 Segmental and somatic dysfunction of lumbar region: Secondary | ICD-10-CM | POA: Diagnosis not present

## 2021-03-01 DIAGNOSIS — M9901 Segmental and somatic dysfunction of cervical region: Secondary | ICD-10-CM | POA: Diagnosis not present

## 2021-03-01 DIAGNOSIS — M9902 Segmental and somatic dysfunction of thoracic region: Secondary | ICD-10-CM | POA: Diagnosis not present

## 2021-03-01 DIAGNOSIS — M9904 Segmental and somatic dysfunction of sacral region: Secondary | ICD-10-CM | POA: Diagnosis not present

## 2021-03-04 DIAGNOSIS — M9901 Segmental and somatic dysfunction of cervical region: Secondary | ICD-10-CM | POA: Diagnosis not present

## 2021-03-04 DIAGNOSIS — M9902 Segmental and somatic dysfunction of thoracic region: Secondary | ICD-10-CM | POA: Diagnosis not present

## 2021-03-04 DIAGNOSIS — M9904 Segmental and somatic dysfunction of sacral region: Secondary | ICD-10-CM | POA: Diagnosis not present

## 2021-03-04 DIAGNOSIS — M9903 Segmental and somatic dysfunction of lumbar region: Secondary | ICD-10-CM | POA: Diagnosis not present

## 2021-03-08 DIAGNOSIS — M9903 Segmental and somatic dysfunction of lumbar region: Secondary | ICD-10-CM | POA: Diagnosis not present

## 2021-03-08 DIAGNOSIS — M9904 Segmental and somatic dysfunction of sacral region: Secondary | ICD-10-CM | POA: Diagnosis not present

## 2021-03-08 DIAGNOSIS — M9901 Segmental and somatic dysfunction of cervical region: Secondary | ICD-10-CM | POA: Diagnosis not present

## 2021-03-08 DIAGNOSIS — M9902 Segmental and somatic dysfunction of thoracic region: Secondary | ICD-10-CM | POA: Diagnosis not present

## 2021-03-10 DIAGNOSIS — M9903 Segmental and somatic dysfunction of lumbar region: Secondary | ICD-10-CM | POA: Diagnosis not present

## 2021-03-10 DIAGNOSIS — M9902 Segmental and somatic dysfunction of thoracic region: Secondary | ICD-10-CM | POA: Diagnosis not present

## 2021-03-10 DIAGNOSIS — M9904 Segmental and somatic dysfunction of sacral region: Secondary | ICD-10-CM | POA: Diagnosis not present

## 2021-03-10 DIAGNOSIS — M9901 Segmental and somatic dysfunction of cervical region: Secondary | ICD-10-CM | POA: Diagnosis not present

## 2021-03-15 DIAGNOSIS — M9901 Segmental and somatic dysfunction of cervical region: Secondary | ICD-10-CM | POA: Diagnosis not present

## 2021-03-15 DIAGNOSIS — M9902 Segmental and somatic dysfunction of thoracic region: Secondary | ICD-10-CM | POA: Diagnosis not present

## 2021-03-15 DIAGNOSIS — M9904 Segmental and somatic dysfunction of sacral region: Secondary | ICD-10-CM | POA: Diagnosis not present

## 2021-03-15 DIAGNOSIS — M9903 Segmental and somatic dysfunction of lumbar region: Secondary | ICD-10-CM | POA: Diagnosis not present

## 2021-03-18 DIAGNOSIS — M9902 Segmental and somatic dysfunction of thoracic region: Secondary | ICD-10-CM | POA: Diagnosis not present

## 2021-03-18 DIAGNOSIS — M9904 Segmental and somatic dysfunction of sacral region: Secondary | ICD-10-CM | POA: Diagnosis not present

## 2021-03-18 DIAGNOSIS — M9901 Segmental and somatic dysfunction of cervical region: Secondary | ICD-10-CM | POA: Diagnosis not present

## 2021-03-18 DIAGNOSIS — M9903 Segmental and somatic dysfunction of lumbar region: Secondary | ICD-10-CM | POA: Diagnosis not present

## 2021-03-22 DIAGNOSIS — M9902 Segmental and somatic dysfunction of thoracic region: Secondary | ICD-10-CM | POA: Diagnosis not present

## 2021-03-22 DIAGNOSIS — M9901 Segmental and somatic dysfunction of cervical region: Secondary | ICD-10-CM | POA: Diagnosis not present

## 2021-03-22 DIAGNOSIS — M9903 Segmental and somatic dysfunction of lumbar region: Secondary | ICD-10-CM | POA: Diagnosis not present

## 2021-03-22 DIAGNOSIS — M9904 Segmental and somatic dysfunction of sacral region: Secondary | ICD-10-CM | POA: Diagnosis not present

## 2021-03-28 DIAGNOSIS — Z882 Allergy status to sulfonamides status: Secondary | ICD-10-CM | POA: Diagnosis not present

## 2021-03-28 DIAGNOSIS — E039 Hypothyroidism, unspecified: Secondary | ICD-10-CM | POA: Diagnosis not present

## 2021-03-28 DIAGNOSIS — Z803 Family history of malignant neoplasm of breast: Secondary | ICD-10-CM | POA: Diagnosis not present

## 2021-03-28 DIAGNOSIS — Z7722 Contact with and (suspected) exposure to environmental tobacco smoke (acute) (chronic): Secondary | ICD-10-CM | POA: Diagnosis not present

## 2021-03-28 DIAGNOSIS — E785 Hyperlipidemia, unspecified: Secondary | ICD-10-CM | POA: Diagnosis not present

## 2021-03-28 DIAGNOSIS — Z6826 Body mass index (BMI) 26.0-26.9, adult: Secondary | ICD-10-CM | POA: Diagnosis not present

## 2021-03-28 DIAGNOSIS — R030 Elevated blood-pressure reading, without diagnosis of hypertension: Secondary | ICD-10-CM | POA: Diagnosis not present

## 2021-03-28 DIAGNOSIS — E663 Overweight: Secondary | ICD-10-CM | POA: Diagnosis not present

## 2021-04-26 DIAGNOSIS — M5416 Radiculopathy, lumbar region: Secondary | ICD-10-CM | POA: Diagnosis not present

## 2021-05-15 ENCOUNTER — Other Ambulatory Visit: Payer: Self-pay | Admitting: Adult Health

## 2021-05-15 DIAGNOSIS — E038 Other specified hypothyroidism: Secondary | ICD-10-CM

## 2021-05-24 DIAGNOSIS — R030 Elevated blood-pressure reading, without diagnosis of hypertension: Secondary | ICD-10-CM | POA: Diagnosis not present

## 2021-05-24 DIAGNOSIS — Z6826 Body mass index (BMI) 26.0-26.9, adult: Secondary | ICD-10-CM | POA: Diagnosis not present

## 2021-05-24 DIAGNOSIS — M5416 Radiculopathy, lumbar region: Secondary | ICD-10-CM | POA: Diagnosis not present

## 2021-05-24 DIAGNOSIS — M5136 Other intervertebral disc degeneration, lumbar region: Secondary | ICD-10-CM | POA: Diagnosis not present

## 2021-06-09 ENCOUNTER — Ambulatory Visit: Payer: Medicare HMO

## 2021-06-22 ENCOUNTER — Telehealth (INDEPENDENT_AMBULATORY_CARE_PROVIDER_SITE_OTHER): Payer: Medicare HMO | Admitting: Family Medicine

## 2021-06-22 ENCOUNTER — Encounter: Payer: Self-pay | Admitting: Family Medicine

## 2021-06-22 DIAGNOSIS — H9202 Otalgia, left ear: Secondary | ICD-10-CM

## 2021-06-22 MED ORDER — CIPROFLOXACIN-DEXAMETHASONE 0.3-0.1 % OT SUSP: 4.0000 [drp] | Freq: Two times a day (BID) | OTIC | 0 refills | Status: DC

## 2021-06-22 NOTE — Progress Notes (Signed)
Subjective:    Patient ID: Angela Houston, female    DOB: November 06, 1950, 70 y.o.   MRN: [id number]  HPI Virtual Visit via Video Note  I connected with the patient on 06/22/21 at  4:00 PM EDT by a video enabled telemedicine application and verified that I am speaking with the correct person using two identifiers.  Location patient: home Location provider:work or home office Persons participating in the virtual visit: patient, provider  I discussed the limitations of evaluation and management by telemedicine and the availability of in person appointments. The patient expressed understanding and agreed to proceed.   HPI: Here for 3 days of left ear pain. She has never felt this before. No sinus pressure or URI symptoms. No pain with chewing. It hurts to wiggle her ear. Applying heat to the ear helps, but taking Ibuprofen does not.    ROS: See pertinent positives and negatives per HPI.  Past Medical History:  Diagnosis Date   Diverticulitis    History of kidney stones    Hyperlipidemia    Hypothyroidism    Kidney stones    Mitral valve prolapse    Ovarian cyst    PONV (postoperative nausea and vomiting)    Thyroid disease     Past Surgical History:  Procedure Laterality Date   APPENDECTOMY     BREAST EXCISIONAL BIOPSY Right    CATARACT EXTRACTION, BILATERAL     COLONOSCOPY  2018   OVARIAN CYST REMOVAL     TONSILLECTOMY     TUBAL LIGATION      Family History  Problem Relation Age of Onset   COPD Father        smoker   Hyperlipidemia Father    Glaucoma Father    Heart disease Father    Breast cancer Sister    Colon cancer Neg Hx      Current Outpatient Medications:    atorvastatin (LIPITOR) 40 MG tablet, TAKE 1 TABLET BY MOUTH EVERY DAY, Disp: 90 tablet, Rfl: 2   b complex vitamins tablet, Take 1 tablet by mouth daily., Disp: , Rfl:    Calcium Carbonate-Vit D-Min (CALCIUM 1200 PO), Take 1,200 mg by mouth daily., Disp: , Rfl:    Cholecalciferol (VITAMIN D) 2000  UNITS CAPS, Take 2,000 Units by mouth daily.  , Disp: , Rfl:    ciprofloxacin-dexamethasone (CIPRODEX) OTIC suspension, Place 4 drops into the left ear 2 (two) times daily., Disp: 7.5 mL, Rfl: 0   CO-ENZYME Q-10 PO, Take 100 mg by mouth daily. , Disp: , Rfl:    diclofenac (VOLTAREN) 75 MG EC tablet, Take 75 mg by mouth 2 (two) times daily., Disp: , Rfl:    dicyclomine (BENTYL) 10 MG capsule, Take 10 mg by mouth daily as needed for spasms. , Disp: , Rfl:    ELDERBERRY PO, Take by mouth., Disp: , Rfl:    fenofibrate (TRICOR) 48 MG tablet, TAKE 1 TABLET BY MOUTH EVERY DAY, Disp: 90 tablet, Rfl: 3   Flaxseed, Linseed, (FLAXSEED OIL) 1000 MG CAPS, Take 1,000 mg by mouth daily., Disp: , Rfl:    hydroxypropyl methylcellulose / hypromellose (ISOPTO TEARS / GONIOVISC) 2.5 % ophthalmic solution, Place 1 drop into both eyes 4 (four) times daily as needed for dry eyes., Disp: , Rfl:    levothyroxine (SYNTHROID) 112 MCG tablet, TAKE 1 TABLET BY MOUTH EVERY DAY, Disp: 90 tablet, Rfl: 0   Lutein 20 MG CAPS, Take 20 mg by mouth daily., Disp: , Rfl:  Probiotic Product (PROBIOTIC DAILY PO), Take by mouth., Disp: , Rfl:   EXAM:  VITALS per patient if applicable:  GENERAL: alert, oriented, appears well and in no acute distress  HEENT: atraumatic, conjunttiva clear, no obvious abnormalities on inspection of external nose and ears  NECK: normal movements of the head and neck  LUNGS: on inspection no signs of respiratory distress, breathing rate appears normal, no obvious gross SOB, gasping or wheezing  CV: no obvious cyanosis  MS: moves all visible extremities without noticeable abnormality  PSYCH/NEURO: pleasant and cooperative, no obvious depression or anxiety, speech and thought processing grossly intact  ASSESSMENT AND PLAN: Earache, treat with Ciprodex drops for several days. If she does not improve over the next few days, I asked her schedule an in person OV so we can examine her.  Alysia Penna,  MD  Discussed the following assessment and plan:  No diagnosis found.     I discussed the assessment and treatment plan with the patient. The patient was provided an opportunity to ask questions and all were answered. The patient agreed with the plan and demonstrated an understanding of the instructions.   The patient was advised to call back or seek an in-person evaluation if the symptoms worsen or if the condition fails to improve as anticipated.      Review of Systems     Objective:   Physical Exam        Assessment & Plan:

## 2021-06-24 ENCOUNTER — Telehealth: Payer: Self-pay

## 2021-06-24 DIAGNOSIS — B029 Zoster without complications: Secondary | ICD-10-CM | POA: Diagnosis not present

## 2021-06-24 DIAGNOSIS — R21 Rash and other nonspecific skin eruption: Secondary | ICD-10-CM | POA: Diagnosis not present

## 2021-06-24 NOTE — Telephone Encounter (Signed)
Patient called stating after seeing Dr. Sarajane Jews virtually she is still having some ear pain and the pain has traveled down into her neck pt was transferred to a triage nurse and back to the office to schedule an office visit but patient stated she can't come in next week b/c she will have her grand kids and to let PCP know what's going on with her.

## 2021-06-24 NOTE — Telephone Encounter (Signed)
Pt states she has gone to [facility] in [place] b/c she could not wait for a visit due to the pain in her hear. She is currently there waiting.

## 2021-06-30 NOTE — Telephone Encounter (Signed)
Spoke to pt and she stated that she was Dx with Shingles. Pt stated she was given Valacyclovir. Pt stated she is feeling a little better and noticed that some of the blisters are drying up. Will let Tommi Rumps know if update.

## 2021-08-02 ENCOUNTER — Ambulatory Visit: Payer: Medicare HMO | Admitting: Adult Health

## 2021-08-14 ENCOUNTER — Other Ambulatory Visit: Payer: Self-pay | Admitting: Adult Health

## 2021-08-14 DIAGNOSIS — E038 Other specified hypothyroidism: Secondary | ICD-10-CM

## 2021-09-10 ENCOUNTER — Other Ambulatory Visit: Payer: Self-pay | Admitting: Adult Health

## 2021-10-07 DIAGNOSIS — Z823 Family history of stroke: Secondary | ICD-10-CM | POA: Diagnosis not present

## 2021-10-07 DIAGNOSIS — R030 Elevated blood-pressure reading, without diagnosis of hypertension: Secondary | ICD-10-CM | POA: Diagnosis not present

## 2021-10-07 DIAGNOSIS — Z803 Family history of malignant neoplasm of breast: Secondary | ICD-10-CM | POA: Diagnosis not present

## 2021-10-07 DIAGNOSIS — Z6826 Body mass index (BMI) 26.0-26.9, adult: Secondary | ICD-10-CM | POA: Diagnosis not present

## 2021-10-07 DIAGNOSIS — G8929 Other chronic pain: Secondary | ICD-10-CM | POA: Diagnosis not present

## 2021-10-07 DIAGNOSIS — E785 Hyperlipidemia, unspecified: Secondary | ICD-10-CM | POA: Diagnosis not present

## 2021-10-07 DIAGNOSIS — E663 Overweight: Secondary | ICD-10-CM | POA: Diagnosis not present

## 2021-10-07 DIAGNOSIS — E039 Hypothyroidism, unspecified: Secondary | ICD-10-CM | POA: Diagnosis not present

## 2021-10-07 DIAGNOSIS — M5430 Sciatica, unspecified side: Secondary | ICD-10-CM | POA: Diagnosis not present

## 2021-10-07 DIAGNOSIS — Z8249 Family history of ischemic heart disease and other diseases of the circulatory system: Secondary | ICD-10-CM | POA: Diagnosis not present

## 2021-10-07 DIAGNOSIS — M8580 Other specified disorders of bone density and structure, unspecified site: Secondary | ICD-10-CM | POA: Diagnosis not present

## 2021-10-07 DIAGNOSIS — M4800 Spinal stenosis, site unspecified: Secondary | ICD-10-CM | POA: Diagnosis not present

## 2021-10-14 ENCOUNTER — Ambulatory Visit (INDEPENDENT_AMBULATORY_CARE_PROVIDER_SITE_OTHER): Payer: Medicare HMO

## 2021-10-14 VITALS — BP 122/64 | HR 77 | Temp 98.0°F | Ht 66.0 in | Wt 179.4 lb

## 2021-10-14 DIAGNOSIS — Z0000 Encounter for general adult medical examination without abnormal findings: Secondary | ICD-10-CM | POA: Diagnosis not present

## 2021-10-14 NOTE — Progress Notes (Signed)
Subjective:   Angela Houston is a 71 y.o. female who presents for Medicare Annual (Subsequent) preventive examination.  Review of Systems     Cardiac Risk Factors include: advanced age (>50men, >50 women)     Objective:    Today's Vitals   10/14/21 0817 10/14/21 0825  BP: 122/64   Pulse: 77   Temp: 98 F (36.7 C)   TempSrc: Oral   SpO2: 97%   Weight: 179 lb 6.4 oz (81.4 kg)   Height: 5\' 6"  (1.676 m)   PainSc:  3    Body mass index is 28.96 kg/m.  Advanced Directives 10/14/2021 06/08/2020 09/13/2018  Does Patient Have a Medical Advance Directive? Yes Yes Yes  Type of Paramedic of [place];Living will [place];Living will [place];Living will  Does patient want to make changes to medical advance directive? No - Patient declined No - Patient declined -  Copy of [place] in Chart? No - copy requested No - copy requested No - copy requested    Current Medications (verified) Outpatient Encounter Medications as of 10/14/2021  Medication Sig   atorvastatin (LIPITOR) 40 MG tablet TAKE 1 TABLET BY MOUTH EVERY DAY   b complex vitamins tablet Take 1 tablet by mouth daily.   Calcium Carbonate-Vit D-Min (CALCIUM 1200 PO) Take 1,200 mg by mouth daily.   Cholecalciferol (VITAMIN D) 2000 UNITS CAPS Take 2,000 Units by mouth daily.     ciprofloxacin-dexamethasone (CIPRODEX) OTIC suspension Place 4 drops into the left ear 2 (two) times daily.   CO-ENZYME Q-10 PO Take 100 mg by mouth daily.    diclofenac (VOLTAREN) 75 MG EC tablet Take 75 mg by mouth 2 (two) times daily.   dicyclomine (BENTYL) 10 MG capsule Take 10 mg by mouth daily as needed for spasms.    ELDERBERRY PO Take by mouth.   fenofibrate (TRICOR) 48 MG tablet TAKE 1 TABLET BY MOUTH EVERY DAY   Flaxseed, Linseed, (FLAXSEED OIL) 1000 MG CAPS Take 1,000 mg by mouth daily.   hydroxypropyl methylcellulose / hypromellose (ISOPTO TEARS / GONIOVISC) 2.5 %  ophthalmic solution Place 1 drop into both eyes 4 (four) times daily as needed for dry eyes.   levothyroxine (SYNTHROID) 112 MCG tablet TAKE 1 TABLET BY MOUTH EVERY DAY   Lutein 20 MG CAPS Take 20 mg by mouth daily.   Probiotic Product (PROBIOTIC DAILY PO) Take by mouth.   No facility-administered encounter medications on file as of 10/14/2021.    Allergies (verified) Tizanidine hcl and Sulfamethoxazole-trimethoprim   History: Past Medical History:  Diagnosis Date   Diverticulitis    History of kidney stones    Hyperlipidemia    Hypothyroidism    Kidney stones    Mitral valve prolapse    Ovarian cyst    PONV (postoperative nausea and vomiting)    Thyroid disease    Past Surgical History:  Procedure Laterality Date   APPENDECTOMY     BREAST EXCISIONAL BIOPSY Right    CATARACT EXTRACTION, BILATERAL     COLONOSCOPY  2018   OVARIAN CYST REMOVAL     TONSILLECTOMY     TUBAL LIGATION     Family History  Problem Relation Age of Onset   COPD Father        smoker   Hyperlipidemia Father    Glaucoma Father    Heart disease Father    Breast cancer Sister    Colon cancer Neg Hx  Social History   Socioeconomic History   Marital status: Married    Spouse name: Not on file   Number of children: 3   Years of education: Not on file   Highest education level: Not on file  Occupational History   Occupation: housewife  Tobacco Use   Smoking status: Never   Smokeless tobacco: Never  Vaping Use   Vaping Use: Never used  Substance and Sexual Activity   Alcohol use: No   Drug use: No   Sexual activity: Not on file  Other Topics Concern   Not on file  Social History Narrative   Not on file   Social Determinants of Health   Financial Resource Strain: Low Risk    Difficulty of Paying Living Expenses: Not hard at all  Food Insecurity: No Food Insecurity   Worried About Charity fundraiser in the Last Year: Never true   [place] in the Last Year: Never true   Transportation Needs: No Transportation Needs   Lack of Transportation (Medical): No   Lack of Transportation (Non-Medical): No  Physical Activity: Sufficiently Active   Days of Exercise per Week: 5 days   Minutes of Exercise per Session: 60 min  Stress: No Stress Concern Present   Feeling of Stress : Only a little  Social Connections: Engineer, building services of Communication with Friends and Family: Not on file   Frequency of Social Gatherings with Friends and Family: More than three times a week   Attends Religious Services: More than 4 times per year   Active Member of [employer] or Organizations: Yes   Attends Music therapist: More than 4 times per year   Marital Status: Married     Clinical Intake:  Pre-visit preparation completed: Yes Diabetic? No  Activities of Daily Living In your present state of health, do you have any difficulty performing the following activities: 10/14/2021  Hearing? N  Vision? N  Difficulty concentrating or making decisions? N  Walking or climbing stairs? N  Dressing or bathing? N  Doing errands, shopping? N  Preparing Food and eating ? N  Using the Toilet? N  In the past six months, have you accidently leaked urine? N  Do you have problems with loss of bowel control? N  Managing your Medications? N  Managing your Finances? N  Housekeeping or managing your Housekeeping? N  Some recent data might be hidden    Patient Care Team: Dorothyann Peng, NP as PCP - General (Family Medicine) Almedia Balls, MD as Referring Physician (Orthopedic Surgery) Misenheimer, Christia Reading, MD as Consulting Physician (Gastroenterology) Jovita Gamma, MD as Consulting Physician (Neurosurgery)  Indicate any recent Medical Services you may have received from other than [facility] providers in the past year (date may be approximate).     Assessment:   This is a routine wellness examination for Angela Houston.  Hearing/Vision screen Hearing Screening -  Comments:: No difficulty hearing Vision Screening - Comments:: Wears glasses. Followed by Dr Satira Sark  Dietary issues and exercise activities discussed: Current Exercise Habits: Home exercise routine, Type of exercise: walking;treadmill, Time (Minutes): 60, Frequency (Times/Week): 5, Weekly Exercise (Minutes/Week): 300, Intensity: Moderate, Exercise limited by: None identified   Goals Addressed               This Visit's Progress     Increase physical activity (pt-stated)        Continue walking and keeping grandchildren.       Depression Screen [facility] 2/9  Scores 10/14/2021 06/08/2020 10/04/2017 10/02/2016 09/27/2015  PHQ - 2 Score 0 0 0 0 0  PHQ- 9 Score - 0 - - -    Fall Risk Fall Risk  10/14/2021 06/08/2020 10/04/2017 10/02/2016 09/27/2015  Falls in the past year? 0 - No No No  Number falls in past yr: 0 0 - - -  Injury with Fall? 0 0 - - -  Risk for fall due to : No Fall Risks No Fall Risks - - -  Follow up - Falls evaluation completed;Falls prevention discussed - - -    FALL RISK PREVENTION PERTAINING TO THE HOME:  Any stairs in or around the home? Yes  If so, are there any without handrails? No  Home free of loose throw rugs in walkways, pet beds, electrical cords, etc? Yes  Adequate lighting in your home to reduce risk of falls? Yes   ASSISTIVE DEVICES UTILIZED TO PREVENT FALLS:  Life alert? No  Use of a cane, walker or w/c? No  Grab bars in the bathroom? Yes  Shower chair or bench in shower? Yes  Elevated toilet seat or a handicapped toilet? No   TIMED UP AND GO:  Was the test performed? Yes .  Length of time to ambulate 10 feet: 5 sec.   Gait steady and fast without use of assistive device  Cognitive Function:     6CIT Screen 10/14/2021 06/08/2020  What Year? 0 points 0 points  What month? 0 points 0 points  What time? 0 points 0 points  Count back from 20 0 points 0 points  Months in reverse 0 points 0 points  Repeat phrase 0 points 0 points  Total Score 0 0     Immunizations Immunization History  Administered Date(s) Administered   Fluad Quad(high Dose 65+) 11/11/2019   Influenza Split 06/04/2012   Influenza Whole 07/26/2009   Influenza, High Dose Seasonal PF 09/27/2015, 10/02/2016, 10/04/2017, 10/24/2018   Influenza,inj,Quad PF,6+ Mos 07/07/2013, 07/08/2014   Pneumococcal Conjugate-13 09/27/2015   Pneumococcal Polysaccharide-23 10/02/2016   Tdap 12/05/2010    TDAP status: Due, Education has been provided regarding the importance of this vaccine. Advised may receive this vaccine at local pharmacy or Health Dept. Aware to provide a copy of the vaccination record if obtained from local pharmacy or Health Dept. Verbalized acceptance and understanding.  Flu Vaccine status: Due, Education has been provided regarding the importance of this vaccine. Advised may receive this vaccine at local pharmacy or Health Dept. Aware to provide a copy of the vaccination record if obtained from local pharmacy or Health Dept. Verbalized acceptance and understanding.  Pneumococcal vaccine status: Up to date  Covid-19 vaccine status: Declined, Education has been provided regarding the importance of this vaccine but patient still declined. Advised may receive this vaccine at local pharmacy or Health Dept.or vaccine clinic. Aware to provide a copy of the vaccination record if obtained from local pharmacy or Health Dept. Verbalized acceptance and understanding.  Qualifies for Shingles Vaccine? Yes   Zostavax completed No   Shingrix Completed?: No.    Education has been provided regarding the importance of this vaccine. Patient has been advised to call insurance company to determine out of pocket expense if they have not yet received this vaccine. Advised may also receive vaccine at local pharmacy or Health Dept. Verbalized acceptance and understanding.  Screening Tests Health Maintenance  Topic Date Due   COVID-19 Vaccine (1) 10/30/2021 (Originally 03/20/1951)    INFLUENZA VACCINE  12/09/2021 (Originally 04/11/2021)  Zoster Vaccines- Shingrix (1 of 2) 01/11/2022 (Originally 09/19/2000)   TETANUS/TDAP  10/14/2022 (Originally 12/04/2020)   MAMMOGRAM  02/19/2023   COLONOSCOPY (Pts 45-57yrs Insurance coverage will need to be confirmed)  03/17/2025   Pneumonia Vaccine 65+ Years old  Completed   DEXA SCAN  Completed   Hepatitis C Screening  Completed   HPV VACCINES  Aged Out    Health Maintenance  There are no preventive care reminders to display for this patient.         Additional Screening:  Vision Screening: Recommended annual ophthalmology exams for early detection of glaucoma and other disorders of the eye. Is the patient up to date with their annual eye exam?  Yes  Who is the provider or what is the name of the office in which the patient attends annual eye exams? Dr Satira Sark If pt is not established with a provider, would they like to be referred to a provider to establish care? No .   Dental Screening: Recommended annual dental exams for proper oral hygiene  Community Resource Referral / Chronic Care Management: CRR required this visit?  No   CCM required this visit?  No      Plan:     I have personally reviewed and noted the following in the patients chart:   Medical and social history Use of alcohol, tobacco or illicit drugs  Current medications and supplements including opioid prescriptions.  Functional ability and status Nutritional status Physical activity Advanced directives List of other physicians Hospitalizations, surgeries, and ER visits in previous 12 months Vitals Screenings to include cognitive, depression, and falls Referrals and appointments  In addition, I have reviewed and discussed with patient certain preventive protocols, quality metrics, and best practice recommendations. A written personalized care plan for preventive services as well as general preventive health recommendations were provided to  patient.     Criselda Peaches, LPN   03/16/5464   Nurse Notes: None

## 2021-10-14 NOTE — Progress Notes (Signed)
Subjective:   Angela Houston is a 71 y.o. female who presents for Medicare Annual (Subsequent) preventive examination.  Review of Systems     Cardiac Risk Factors include: advanced age (>11men, >59 women)     Objective:    Today's Vitals   10/14/21 0817 10/14/21 0825  BP: 122/64   Pulse: 77   Temp: 98 F (36.7 C)   TempSrc: Oral   SpO2: 97%   Weight: 179 lb 6.4 oz (81.4 kg)   Height: 5\' 6"  (1.676 m)   PainSc:  3    Body mass index is 28.96 kg/m.  Advanced Directives 10/14/2021 06/08/2020 09/13/2018  Does Patient Have a Medical Advance Directive? Yes Yes Yes  Type of Paramedic of [place];Living will [place];Living will [place];Living will  Does patient want to make changes to medical advance directive? No - Patient declined No - Patient declined -  Copy of [place] in Chart? No - copy requested No - copy requested No - copy requested    Current Medications (verified) Outpatient Encounter Medications as of 10/14/2021  Medication Sig   atorvastatin (LIPITOR) 40 MG tablet TAKE 1 TABLET BY MOUTH EVERY DAY   b complex vitamins tablet Take 1 tablet by mouth daily.   Calcium Carbonate-Vit D-Min (CALCIUM 1200 PO) Take 1,200 mg by mouth daily.   Cholecalciferol (VITAMIN D) 2000 UNITS CAPS Take 2,000 Units by mouth daily.     ciprofloxacin-dexamethasone (CIPRODEX) OTIC suspension Place 4 drops into the left ear 2 (two) times daily.   CO-ENZYME Q-10 PO Take 100 mg by mouth daily.    diclofenac (VOLTAREN) 75 MG EC tablet Take 75 mg by mouth 2 (two) times daily.   dicyclomine (BENTYL) 10 MG capsule Take 10 mg by mouth daily as needed for spasms.    ELDERBERRY PO Take by mouth.   fenofibrate (TRICOR) 48 MG tablet TAKE 1 TABLET BY MOUTH EVERY DAY   Flaxseed, Linseed, (FLAXSEED OIL) 1000 MG CAPS Take 1,000 mg by mouth daily.   hydroxypropyl methylcellulose / hypromellose (ISOPTO TEARS / GONIOVISC) 2.5 %  ophthalmic solution Place 1 drop into both eyes 4 (four) times daily as needed for dry eyes.   levothyroxine (SYNTHROID) 112 MCG tablet TAKE 1 TABLET BY MOUTH EVERY DAY   Lutein 20 MG CAPS Take 20 mg by mouth daily.   Probiotic Product (PROBIOTIC DAILY PO) Take by mouth.   No facility-administered encounter medications on file as of 10/14/2021.    Allergies (verified) Tizanidine hcl and Sulfamethoxazole-trimethoprim   History: Past Medical History:  Diagnosis Date   Diverticulitis    History of kidney stones    Hyperlipidemia    Hypothyroidism    Kidney stones    Mitral valve prolapse    Ovarian cyst    PONV (postoperative nausea and vomiting)    Thyroid disease    Past Surgical History:  Procedure Laterality Date   APPENDECTOMY     BREAST EXCISIONAL BIOPSY Right    CATARACT EXTRACTION, BILATERAL     COLONOSCOPY  2018   OVARIAN CYST REMOVAL     TONSILLECTOMY     TUBAL LIGATION     Family History  Problem Relation Age of Onset   COPD Father        smoker   Hyperlipidemia Father    Glaucoma Father    Heart disease Father    Breast cancer Sister    Colon cancer Neg Hx  Social History   Socioeconomic History   Marital status: Married    Spouse name: Not on file   Number of children: 3   Years of education: Not on file   Highest education level: Not on file  Occupational History   Occupation: housewife  Tobacco Use   Smoking status: Never   Smokeless tobacco: Never  Vaping Use   Vaping Use: Never used  Substance and Sexual Activity   Alcohol use: No   Drug use: No   Sexual activity: Not on file  Other Topics Concern   Not on file  Social History Narrative   Not on file   Social Determinants of Health   Financial Resource Strain: Low Risk    Difficulty of Paying Living Expenses: Not hard at all  Food Insecurity: No Food Insecurity   Worried About Charity fundraiser in the Last Year: Never true   [place] in the Last Year: Never true   Transportation Needs: No Transportation Needs   Lack of Transportation (Medical): No   Lack of Transportation (Non-Medical): No  Physical Activity: Sufficiently Active   Days of Exercise per Week: 5 days   Minutes of Exercise per Session: 60 min  Stress: No Stress Concern Present   Feeling of Stress : Only a little  Social Connections: Engineer, building services of Communication with Friends and Family: Not on file   Frequency of Social Gatherings with Friends and Family: More than three times a week   Attends Religious Services: More than 4 times per year   Active Member of [employer] or Organizations: Yes   Attends Music therapist: More than 4 times per year   Marital Status: Married    Tobacco Counseling Counseling given: Not Answered   Clinical Intake:  Pre-visit preparation completed: Yes  Pain : 0-10 Pain Score: 3  Pain Type: Chronic pain Pain Location: Back Pain Orientation: Lower Pain Descriptors / Indicators: Aching Pain Onset: Today Pain Frequency: Intermittent Pain Relieving Factors: Tylenol Effect of Pain on Daily Activities: None  Pain Relieving Factors: Tylenol  Nutritional Risks: None Diabetes: No  How often do you need to have someone help you when you read instructions, pamphlets, or other written materials from your doctor or pharmacy?: 1 - Never  Diabetic? No  Interpreter Needed?: No  Information entered by :: Rolene Arbour LPN   Activities of Daily Living In your present state of health, do you have any difficulty performing the following activities: 10/14/2021  Hearing? N  Vision? N  Difficulty concentrating or making decisions? N  Walking or climbing stairs? N  Dressing or bathing? N  Doing errands, shopping? N  Preparing Food and eating ? N  Using the Toilet? N  In the past six months, have you accidently leaked urine? N  Do you have problems with loss of bowel control? N  Managing your Medications? N  Managing your  Finances? N  Housekeeping or managing your Housekeeping? N  Some recent data might be hidden    Patient Care Team: Dorothyann Peng, NP as PCP - General (Family Medicine) Almedia Balls, MD as Referring Physician (Orthopedic Surgery) Misenheimer, Christia Reading, MD as Consulting Physician (Gastroenterology) Jovita Gamma, MD as Consulting Physician (Neurosurgery)  Indicate any recent Medical Services you may have received from other than [facility] providers in the past year (date may be approximate).     Assessment:   This is a routine wellness examination for [place].  Hearing/Vision screen Hearing Screening -  Comments:: No difficulty hearing Vision Screening - Comments:: Wears glasses. Followed by Dr Satira Sark  Dietary issues and exercise activities discussed: Current Exercise Habits: Home exercise routine, Type of exercise: walking;treadmill, Time (Minutes): 60, Frequency (Times/Week): 5, Weekly Exercise (Minutes/Week): 300, Intensity: Moderate, Exercise limited by: None identified   Goals Addressed               This Visit's Progress     Increase physical activity (pt-stated)        Continue walking and keeping grandchildren.       Depression Screen PHQ 2/9 Scores 10/14/2021 06/08/2020 10/04/2017 10/02/2016 09/27/2015  PHQ - 2 Score 0 0 0 0 0  PHQ- 9 Score - 0 - - -    Fall Risk Fall Risk  10/14/2021 06/08/2020 10/04/2017 10/02/2016 09/27/2015  Falls in the past year? 0 - No No No  Number falls in past yr: 0 0 - - -  Injury with Fall? 0 0 - - -  Risk for fall due to : No Fall Risks No Fall Risks - - -  Follow up - Falls evaluation completed;Falls prevention discussed - - -    FALL RISK PREVENTION PERTAINING TO THE HOME:  Any stairs in or around the home? Yes  If so, are there any without handrails? No  Home free of loose throw rugs in walkways, pet beds, electrical cords, etc? Yes  Adequate lighting in your home to reduce risk of falls? Yes   ASSISTIVE DEVICES UTILIZED TO PREVENT  FALLS:  Life alert? No  Use of a cane, walker or w/c? No  Grab bars in the bathroom? Yes  Shower chair or bench in shower? No  Elevated toilet seat or a handicapped toilet? Yes   TIMED UP AND GO:  Was the test performed? Yes .  Length of time to ambulate 10 feet: 5 sec.   Gait steady and fast without use of assistive device  Cognitive Function:     6CIT Screen 10/14/2021 06/08/2020  What Year? 0 points 0 points  What month? 0 points 0 points  What time? 0 points 0 points  Count back from 20 0 points 0 points  Months in reverse 0 points 0 points  Repeat phrase 0 points 0 points  Total Score 0 0    Immunizations Immunization History  Administered Date(s) Administered   Fluad Quad(high Dose 65+) 11/11/2019   Influenza Split 06/04/2012   Influenza Whole 07/26/2009   Influenza, High Dose Seasonal PF 09/27/2015, 10/02/2016, 10/04/2017, 10/24/2018   Influenza,inj,Quad PF,6+ Mos 07/07/2013, 07/08/2014   Pneumococcal Conjugate-13 09/27/2015   Pneumococcal Polysaccharide-23 10/02/2016   Tdap 12/05/2010    TDAP status: Due, Education has been provided regarding the importance of this vaccine. Advised may receive this vaccine at local pharmacy or Health Dept. Aware to provide a copy of the vaccination record if obtained from local pharmacy or Health Dept. Verbalized acceptance and understanding.  Flu Vaccine status: Declined, Education has been provided regarding the importance of this vaccine but patient still declined. Advised may receive this vaccine at local pharmacy or Health Dept. Aware to provide a copy of the vaccination record if obtained from local pharmacy or Health Dept. Verbalized acceptance and understanding.  Pneumococcal vaccine status: Up to date  Covid-19 vaccine status: Declined, Education has been provided regarding the importance of this vaccine but patient still declined. Advised may receive this vaccine at local pharmacy or Health Dept.or vaccine clinic. Aware  to provide a copy of the vaccination record if  obtained from local pharmacy or Health Dept. Verbalized acceptance and understanding.  Qualifies for Shingles Vaccine? Yes   Zostavax completed No   Shingrix Completed?: No.    Education has been provided regarding the importance of this vaccine. Patient has been advised to call insurance company to determine out of pocket expense if they have not yet received this vaccine. Advised may also receive vaccine at local pharmacy or Health Dept. Verbalized acceptance and understanding.  Screening Tests Health Maintenance  Topic Date Due   COVID-19 Vaccine (1) 10/30/2021 (Originally 03/20/1951)   INFLUENZA VACCINE  12/09/2021 (Originally 04/11/2021)   Zoster Vaccines- Shingrix (1 of 2) 01/11/2022 (Originally 09/19/2000)   TETANUS/TDAP  10/14/2022 (Originally 12/04/2020)   MAMMOGRAM  02/19/2023   COLONOSCOPY (Pts 45-23yrs Insurance coverage will need to be confirmed)  03/17/2025   Pneumonia Vaccine 66+ Years old  Completed   DEXA SCAN  Completed   Hepatitis C Screening  Completed   HPV VACCINES  Aged Out    Health Maintenance  There are no preventive care reminders to display for this patient.   Additional Screening:   Vision Screening: Recommended annual ophthalmology exams for early detection of glaucoma and other disorders of the eye. Is the patient up to date with their annual eye exam?  Yes  Who is the provider or what is the name of the office in which the patient attends annual eye exams? Dr Satira Sark If pt is not established with a provider, would they like to be referred to a provider to establish care? No .   Dental Screening: Recommended annual dental exams for proper oral hygiene  Community Resource Referral / Chronic Care Management: CRR required this visit?  No   CCM required this visit?  No      Plan:     I have personally reviewed and noted the following in the patients chart:   Medical and social history Use of alcohol,  tobacco or illicit drugs  Current medications and supplements including opioid prescriptions.  Functional ability and status Nutritional status Physical activity Advanced directives List of other physicians Hospitalizations, surgeries, and ER visits in previous 12 months Vitals Screenings to include cognitive, depression, and falls Referrals and appointments  In addition, I have reviewed and discussed with patient certain preventive protocols, quality metrics, and best practice recommendations. A written personalized care plan for preventive services as well as general preventive health recommendations were provided to patient.     Criselda Peaches, LPN   0/04/6760   Nurse Notes: None

## 2021-10-14 NOTE — Patient Instructions (Addendum)
Angela Houston , Thank you for taking time to come for your Medicare Wellness Visit. I appreciate your ongoing commitment to your health goals. Please review the following plan we discussed and let me know if I can assist you in the future.   These are the goals we discussed:  Goals       Increase physical activity (pt-stated)      Continue walking and keeping grandchildren.      Weight (lb) < 140 lb (63.5 kg)        This is a list of the screening recommended for you and due dates:  Health Maintenance  Topic Date Due   COVID-19 Vaccine (1) 10/30/2021*   Flu Shot  12/09/2021*   Zoster (Shingles) Vaccine (1 of 2) 01/11/2022*   Tetanus Vaccine  10/14/2022*   Mammogram  02/19/2023   Colon Cancer Screening  03/17/2025   Pneumonia Vaccine  Completed   DEXA scan (bone density measurement)  Completed   Hepatitis C Screening: USPSTF Recommendation to screen - Ages 32-79 yo.  Completed   HPV Vaccine  Aged Out  *Topic was postponed. The date shown is not the original due date.   Advanced directives: Yes Patient will bring copy  Conditions/risks identified: None  Next appointment: Follow up in one year for your annual wellness visit    Preventive Care 65 Years and Older, Female Preventive care refers to lifestyle choices and visits with your health care provider that can promote health and wellness. What does preventive care include? A yearly physical exam. This is also called an annual well check. Dental exams once or twice a year. Routine eye exams. Ask your health care provider how often you should have your eyes checked. Personal lifestyle choices, including: Daily care of your teeth and gums. Regular physical activity. Eating a healthy diet. Avoiding tobacco and drug use. Limiting alcohol use. Practicing safe sex. Taking low-dose aspirin every day. Taking vitamin and mineral supplements as recommended by your health care provider. What happens during an annual well  check? The services and screenings done by your health care provider during your annual well check will depend on your age, overall health, lifestyle risk factors, and family history of disease. Counseling  Your health care provider may ask you questions about your: Alcohol use. Tobacco use. Drug use. Emotional well-being. Home and relationship well-being. Sexual activity. Eating habits. History of falls. Memory and ability to understand (cognition). Work and work Statistician. Reproductive health. Screening  You may have the following tests or measurements: Height, weight, and BMI. Blood pressure. Lipid and cholesterol levels. These may be checked every 5 years, or more frequently if you are over 63 years old. Skin check. Lung cancer screening. You may have this screening every year starting at age 53 if you have a 30-pack-year history of smoking and currently smoke or have quit within the past 15 years. Fecal occult blood test (FOBT) of the stool. You may have this test every year starting at age 10. Flexible sigmoidoscopy or colonoscopy. You may have a sigmoidoscopy every 5 years or a colonoscopy every 10 years starting at age 36. Hepatitis C blood test. Hepatitis B blood test. Sexually transmitted disease (STD) testing. Diabetes screening. This is done by checking your blood sugar (glucose) after you have not eaten for a while (fasting). You may have this done every 1-3 years. Bone density scan. This is done to screen for osteoporosis. You may have this done starting at age 70. Mammogram. This may be  done every 1-2 years. Talk to your health care provider about how often you should have regular mammograms. Talk with your health care provider about your test results, treatment options, and if necessary, the need for more tests. Vaccines  Your health care provider may recommend certain vaccines, such as: Influenza vaccine. This is recommended every year. Tetanus, diphtheria, and  acellular pertussis (Tdap, Td) vaccine. You may need a Td booster every 10 years. Zoster vaccine. You may need this after age 57. Pneumococcal 13-valent conjugate (PCV13) vaccine. One dose is recommended after age 48. Pneumococcal polysaccharide (PPSV23) vaccine. One dose is recommended after age 50. Talk to your health care provider about which screenings and vaccines you need and how often you need them. This information is not intended to replace advice given to you by your health care provider. Make sure you discuss any questions you have with your health care provider. Document Released: 09/24/2015 Document Revised: 05/17/2016 Document Reviewed: 06/29/2015 Elsevier Interactive Patient Education  2017 [employer] Prevention in the Home Falls can cause injuries. They can happen to people of all ages. There are many things you can do to make your home safe and to help prevent falls. What can I do on the outside of my home? Regularly fix the edges of walkways and driveways and fix any cracks. Remove anything that might make you trip as you walk through a door, such as a raised step or threshold. Trim any bushes or trees on the path to your home. Use bright outdoor lighting. Clear any walking paths of anything that might make someone trip, such as rocks or tools. Regularly check to see if handrails are loose or broken. Make sure that both sides of any steps have handrails. Any raised decks and porches should have guardrails on the edges. Have any leaves, snow, or ice cleared regularly. Use sand or salt on walking paths during winter. Clean up any spills in your garage right away. This includes oil or grease spills. What can I do in the bathroom? Use night lights. Install grab bars by the toilet and in the tub and shower. Do not use towel bars as grab bars. Use non-skid mats or decals in the tub or shower. If you need to sit down in the shower, use a plastic, non-slip stool. Keep  the floor dry. Clean up any water that spills on the floor as soon as it happens. Remove soap buildup in the tub or shower regularly. Attach bath mats securely with double-sided non-slip rug tape. Do not have throw rugs and other things on the floor that can make you trip. What can I do in the bedroom? Use night lights. Make sure that you have a light by your bed that is easy to reach. Do not use any sheets or blankets that are too big for your bed. They should not hang down onto the floor. Have a firm chair that has side arms. You can use this for support while you get dressed. Do not have throw rugs and other things on the floor that can make you trip. What can I do in the kitchen? Clean up any spills right away. Avoid walking on wet floors. Keep items that you use a lot in easy-to-reach places. If you need to reach something above you, use a strong step stool that has a grab bar. Keep electrical cords out of the way. Do not use floor polish or wax that makes floors slippery. If you must use wax,  use non-skid floor wax. Do not have throw rugs and other things on the floor that can make you trip. What can I do with my stairs? Do not leave any items on the stairs. Make sure that there are handrails on both sides of the stairs and use them. Fix handrails that are broken or loose. Make sure that handrails are as long as the stairways. Check any carpeting to make sure that it is firmly attached to the stairs. Fix any carpet that is loose or worn. Avoid having throw rugs at the top or bottom of the stairs. If you do have throw rugs, attach them to the floor with carpet tape. Make sure that you have a light switch at the top of the stairs and the bottom of the stairs. If you do not have them, ask someone to add them for you. What else can I do to help prevent falls? Wear shoes that: Do not have high heels. Have rubber bottoms. Are comfortable and fit you well. Are closed at the toe. Do not  wear sandals. If you use a stepladder: Make sure that it is fully opened. Do not climb a closed stepladder. Make sure that both sides of the stepladder are locked into place. Ask someone to hold it for you, if possible. Clearly mark and make sure that you can see: Any grab bars or handrails. First and last steps. Where the edge of each step is. Use tools that help you move around (mobility aids) if they are needed. These include: Canes. Walkers. Scooters. Crutches. Turn on the lights when you go into a dark area. Replace any light bulbs as soon as they burn out. Set up your furniture so you have a clear path. Avoid moving your furniture around. If any of your floors are uneven, fix them. If there are any pets around you, be aware of where they are. Review your medicines with your doctor. Some medicines can make you feel dizzy. This can increase your chance of falling. Ask your doctor what other things that you can do to help prevent falls. This information is not intended to replace advice given to you by your health care provider. Make sure you discuss any questions you have with your health care provider. Document Released: 06/24/2009 Document Revised: 02/03/2016 Document Reviewed: 10/02/2014 Elsevier Interactive Patient Education  2017 [employer].

## 2021-11-19 ENCOUNTER — Other Ambulatory Visit: Payer: Self-pay | Admitting: Adult Health

## 2021-11-19 DIAGNOSIS — E038 Other specified hypothyroidism: Secondary | ICD-10-CM

## 2021-11-22 NOTE — Telephone Encounter (Signed)
Spoke to pt to schedule CPE. Pt stated she has a lot of personal stuff and is unable to come in any time soon. Pt advised that Rx would be sent in for 30  days but she will need a CPE within those days for refill purposes. Pt verbalized understanding and will call back to schedule.  ?

## 2021-12-17 ENCOUNTER — Other Ambulatory Visit: Payer: Self-pay | Admitting: Adult Health

## 2021-12-17 DIAGNOSIS — E038 Other specified hypothyroidism: Secondary | ICD-10-CM

## 2021-12-30 ENCOUNTER — Other Ambulatory Visit: Payer: Self-pay | Admitting: Adult Health

## 2021-12-30 ENCOUNTER — Ambulatory Visit (INDEPENDENT_AMBULATORY_CARE_PROVIDER_SITE_OTHER): Payer: Medicare HMO | Admitting: Adult Health

## 2021-12-30 ENCOUNTER — Encounter: Payer: Self-pay | Admitting: Adult Health

## 2021-12-30 VITALS — BP 126/60 | HR 79 | Temp 98.2°F | Ht 65.75 in | Wt 176.0 lb

## 2021-12-30 DIAGNOSIS — E039 Hypothyroidism, unspecified: Secondary | ICD-10-CM

## 2021-12-30 DIAGNOSIS — E782 Mixed hyperlipidemia: Secondary | ICD-10-CM

## 2021-12-30 DIAGNOSIS — Z0000 Encounter for general adult medical examination without abnormal findings: Secondary | ICD-10-CM | POA: Diagnosis not present

## 2021-12-30 DIAGNOSIS — M5126 Other intervertebral disc displacement, lumbar region: Secondary | ICD-10-CM | POA: Diagnosis not present

## 2021-12-30 LAB — CBC WITH DIFFERENTIAL/PLATELET
Basophils Absolute: 0 10*3/uL (ref 0.0–0.1)
Basophils Relative: 1.1 % (ref 0.0–3.0)
Eosinophils Absolute: 0.1 10*3/uL (ref 0.0–0.7)
Eosinophils Relative: 3.1 % (ref 0.0–5.0)
HCT: 43.4 % (ref 36.0–46.0)
Hemoglobin: 14.5 g/dL (ref 12.0–15.0)
Lymphocytes Relative: 32.2 % (ref 12.0–46.0)
Lymphs Abs: 1.3 10*3/uL (ref 0.7–4.0)
MCHC: 33.4 g/dL (ref 30.0–36.0)
MCV: 90.6 fl (ref 78.0–100.0)
Monocytes Absolute: 0.6 10*3/uL (ref 0.1–1.0)
Monocytes Relative: 14.7 % — ABNORMAL HIGH (ref 3.0–12.0)
Neutro Abs: 2 10*3/uL (ref 1.4–7.7)
Neutrophils Relative %: 48.9 % (ref 43.0–77.0)
Platelets: 297 10*3/uL (ref 150.0–400.0)
RBC: 4.8 Mil/uL (ref 3.87–5.11)
RDW: 13.4 % (ref 11.5–15.5)
WBC: 4.1 10*3/uL (ref 4.0–10.5)

## 2021-12-30 LAB — COMPREHENSIVE METABOLIC PANEL WITH GFR
ALT: 38 U/L — ABNORMAL HIGH (ref 0–35)
AST: 40 U/L — ABNORMAL HIGH (ref 0–37)
Albumin: 4.4 g/dL (ref 3.5–5.2)
Alkaline Phosphatase: 90 U/L (ref 39–117)
BUN: 17 mg/dL (ref 6–23)
CO2: 26 meq/L (ref 19–32)
Calcium: 9.6 mg/dL (ref 8.4–10.5)
Chloride: 105 meq/L (ref 96–112)
Creatinine, Ser: 0.8 mg/dL (ref 0.40–1.20)
GFR: 74.2 mL/min (ref >60.00)
Glucose, Bld: 99 mg/dL (ref 70–99)
Potassium: 4.2 meq/L (ref 3.5–5.1)
Sodium: 141 meq/L (ref 135–145)
Total Bilirubin: 0.6 mg/dL (ref 0.2–1.2)
Total Protein: 7 g/dL (ref 6.0–8.3)

## 2021-12-30 LAB — LIPID PANEL
Cholesterol: 173 mg/dL (ref 0–200)
HDL: 61 mg/dL (ref >39.00)
LDL Cholesterol: 96 mg/dL (ref 0–99)
NonHDL: 111.5
Total CHOL/HDL Ratio: 3
Triglycerides: 77 mg/dL (ref 0.0–149.0)
VLDL: 15.4 mg/dL (ref 0.0–40.0)

## 2021-12-30 LAB — TSH: TSH: 0.27 u[IU]/mL — ABNORMAL LOW (ref 0.35–5.50)

## 2021-12-30 MED ORDER — FENOFIBRATE 48 MG PO TABS: 48.0000 mg | ORAL_TABLET | Freq: Every day | ORAL | 3 refills | Status: DC

## 2021-12-30 NOTE — Progress Notes (Signed)
? ?Subjective:  ? ? Patient ID: Angela Houston, female    DOB: 10/13/1950, 71 y.o.   MRN: [id number] ? ?HPI ?Patient presents for yearly preventative medicine examination. She is a pleasant 71 year old female who  has a past medical history of Diverticulitis, History of kidney stones, Hyperlipidemia, Hypothyroidism, Kidney stones, Mitral valve prolapse, Ovarian cyst, PONV (postoperative nausea and vomiting), and Thyroid disease. ? ?Hypothyroidism -managed with Synthroid 112 mcg daily.  She does feel controlled on this medication ?Lab Results  ?Component Value Date  ? TSH 0.96 11/19/2020  ? ?Hyperlipidemia-managed with Lipitor 40 mg daily and Tricor 48 mg daily.  She denies myalgia or fatigue ?Lab Results  ?Component Value Date  ? CHOL 191 11/19/2020  ? HDL 59.40 11/19/2020  ? LDLCALC 106 (H) 11/19/2020  ? LDLDIRECT 101.0 02/18/2020  ? TRIG 129.0 11/19/2020  ? CHOLHDL 3 11/19/2020  ? ?Lumbar radiculopathy-managed by [facility]. She continues to have pain but not as bad. IS going to a chiropractor.  ? ?All immunizations and health maintenance protocols were reviewed with the patient and needed orders were placed. ? ?Appropriate screening laboratory values were ordered for the patient including screening of hyperlipidemia, renal function and hepatic function. ? ?Medication reconciliation,  past medical history, social history, problem list and allergies were reviewed in detail with the patient ? ?Goals were established with regard to weight loss, exercise, and  diet in compliance with medications ? ?Review of Systems  ?Constitutional: Negative.   ?HENT: Negative.    ?Eyes: Negative.   ?Respiratory: Negative.    ?Cardiovascular: Negative.   ?Gastrointestinal: Negative.   ?Endocrine: Negative.   ?Genitourinary: Negative.   ?Musculoskeletal:  Positive for arthralgias and back pain.  ?Skin: Negative.   ?Allergic/Immunologic: Negative.   ?Hematological: Negative.   ?Psychiatric/Behavioral: Negative.    ?All  other systems reviewed and are negative. ? ?Past Medical History:  ?Diagnosis Date  ? Diverticulitis   ? History of kidney stones   ? Hyperlipidemia   ? Hypothyroidism   ? Kidney stones   ? Mitral valve prolapse   ? Ovarian cyst   ? PONV (postoperative nausea and vomiting)   ? Thyroid disease   ? ? ?Social History  ? ?Socioeconomic History  ? Marital status: Married  ?  Spouse name: Not on file  ? Number of children: 3  ? Years of education: Not on file  ? Highest education level: Not on file  ?Occupational History  ? Occupation: housewife  ?Tobacco Use  ? Smoking status: Never  ? Smokeless tobacco: Never  ?Vaping Use  ? Vaping Use: Never used  ?Substance and Sexual Activity  ? Alcohol use: No  ? Drug use: No  ? Sexual activity: Not on file  ?Other Topics Concern  ? Not on file  ?Social History Narrative  ? Not on file  ? ?Social Determinants of Health  ? ?Financial Resource Strain: Low Risk   ? Difficulty of Paying Living Expenses: Not hard at all  ?Food Insecurity: No Food Insecurity  ? Worried About Charity fundraiser in the Last Year: Never true  ? Ran Out of Food in the Last Year: Never true  ?Transportation Needs: No Transportation Needs  ? Lack of Transportation (Medical): No  ? Lack of Transportation (Non-Medical): No  ?Physical Activity: Sufficiently Active  ? Days of Exercise per Week: 5 days  ? Minutes of Exercise per Session: 60 min  ?Stress: No Stress Concern Present  ? Feeling of  Stress : Only a little  ?Social Connections: Socially Integrated  ? Frequency of Communication with Friends and Family: Not on file  ? Frequency of Social Gatherings with Friends and Family: More than three times a week  ? Attends Religious Services: More than 4 times per year  ? Active Member of Clubs or Organizations: Yes  ? Attends Archivist Meetings: More than 4 times per year  ? Marital Status: Married  ?Intimate Partner Violence: Not At Risk  ? Fear of Current or Ex-Partner: No  ? Emotionally Abused: No  ?  Physically Abused: No  ? Sexually Abused: No  ? ? ?Past Surgical History:  ?Procedure Laterality Date  ? APPENDECTOMY    ? BREAST EXCISIONAL BIOPSY Right   ? CATARACT EXTRACTION, BILATERAL    ? COLONOSCOPY  2018  ? OVARIAN CYST REMOVAL    ? TONSILLECTOMY    ? TUBAL LIGATION    ? ? ?Family History  ?Problem Relation Age of Onset  ? COPD Father   ?     smoker  ? Hyperlipidemia Father   ? Glaucoma Father   ? Heart disease Father   ? Breast cancer Sister   ? Colon cancer Neg Hx   ? ? ?Allergies  ?Allergen Reactions  ? Tizanidine Hcl Anaphylaxis  ? Sulfamethoxazole-Trimethoprim Rash and Other (See Comments)  ?  fever,headache,chills  ? Gabapentin   ?  100 mg pt stated it caused confusion  ? ? ?Current Outpatient Medications on File Prior to Visit  ?Medication Sig Dispense Refill  ? atorvastatin (LIPITOR) 40 MG tablet TAKE 1 TABLET BY MOUTH EVERY DAY 90 tablet 2  ? b complex vitamins tablet Take 1 tablet by mouth daily.    ? Calcium Carbonate-Vit D-Min (CALCIUM 1200 PO) Take 1,200 mg by mouth daily.    ? Cholecalciferol (VITAMIN D) 2000 UNITS CAPS Take 2,000 Units by mouth daily.      ? CO-ENZYME Q-10 PO Take 100 mg by mouth daily.     ? Diclofenac Sodium (VOLTAREN EX) Apply topically. PRN    ? dicyclomine (BENTYL) 10 MG capsule Take 10 mg by mouth daily as needed for spasms.     ? dicyclomine (BENTYL) 10 MG capsule Take by mouth.    ? ELDERBERRY PO Take by mouth.    ? fenofibrate (TRICOR) 48 MG tablet TAKE 1 TABLET BY MOUTH EVERY DAY 90 tablet 3  ? Flaxseed, Linseed, (FLAXSEED OIL) 1000 MG CAPS Take 1,000 mg by mouth daily.    ? hydroxypropyl methylcellulose / hypromellose (ISOPTO TEARS / GONIOVISC) 2.5 % ophthalmic solution Place 1 drop into both eyes 4 (four) times daily as needed for dry eyes.    ? levothyroxine (SYNTHROID) 112 MCG tablet Take 1 tablet (112 mcg total) by mouth daily. 30 tablet 5  ? Lutein 20 MG CAPS Take 20 mg by mouth daily.    ? Probiotic Product (PROBIOTIC DAILY PO) Take by mouth.    ? ?No current  facility-administered medications on file prior to visit.  ? ? ?BP 126/60   Pulse 79   Temp 98.2 ?F (36.8 ?C) (Oral)   Ht 5' 5.75" (1.67 m)   Wt 176 lb (79.8 kg)   SpO2 99%   BMI 28.62 kg/m?  ? ? ?   ?Objective:  ? Physical Exam ?Vitals and nursing note reviewed.  ?Constitutional:   ?   General: She is not in acute distress. ?   Appearance: Normal appearance. She is well-developed. She is not  ill-appearing.  ?HENT:  ?   Head: Normocephalic and atraumatic.  ?   Right Ear: Tympanic membrane, ear canal and external ear normal. There is no impacted cerumen.  ?   Left Ear: Tympanic membrane, ear canal and external ear normal. There is no impacted cerumen.  ?   Nose: Nose normal. No congestion or rhinorrhea.  ?   Mouth/Throat:  ?   Mouth: Mucous membranes are moist.  ?   Pharynx: Oropharynx is clear. No oropharyngeal exudate or posterior oropharyngeal erythema.  ?Eyes:  ?   General:     ?   Right eye: No discharge.     ?   Left eye: No discharge.  ?   Extraocular Movements: Extraocular movements intact.  ?   Conjunctiva/sclera: Conjunctivae normal.  ?   Pupils: Pupils are equal, round, and reactive to light.  ?Neck:  ?   Thyroid: No thyromegaly.  ?   Vascular: No carotid bruit.  ?   Trachea: No tracheal deviation.  ?Cardiovascular:  ?   Rate and Rhythm: Normal rate and regular rhythm.  ?   Pulses: Normal pulses.  ?   Heart sounds: Normal heart sounds. No murmur heard. ?  No friction rub. No gallop.  ?Pulmonary:  ?   Effort: Pulmonary effort is normal. No respiratory distress.  ?   Breath sounds: Normal breath sounds. No stridor. No wheezing, rhonchi or rales.  ?Chest:  ?   Chest wall: No tenderness.  ?Abdominal:  ?   General: Abdomen is flat. Bowel sounds are normal. There is no distension.  ?   Palpations: Abdomen is soft. There is no mass.  ?   Tenderness: There is no abdominal tenderness. There is no right CVA tenderness, left CVA tenderness, guarding or rebound.  ?   Hernia: No hernia is present.   ?Musculoskeletal:     ?   General: No swelling, tenderness, deformity or signs of injury. Normal range of motion.  ?   Cervical back: Normal range of motion and neck supple.  ?   Right lower leg: No edema.  ?   Left lower

## 2021-12-30 NOTE — Patient Instructions (Signed)
It was great seeing you today   We will follow up with you regarding your lab work   Please let me know if you need anything   

## 2022-01-10 ENCOUNTER — Other Ambulatory Visit: Payer: Self-pay

## 2022-01-10 MED ORDER — LEVOTHYROXINE SODIUM 100 MCG PO TABS: 100.0000 ug | ORAL_TABLET | Freq: Every day | ORAL | 3 refills | Status: DC

## 2022-01-25 DIAGNOSIS — R1310 Dysphagia, unspecified: Secondary | ICD-10-CM | POA: Diagnosis not present

## 2022-01-30 ENCOUNTER — Other Ambulatory Visit: Payer: Self-pay | Admitting: Adult Health

## 2022-01-30 DIAGNOSIS — Z1231 Encounter for screening mammogram for malignant neoplasm of breast: Secondary | ICD-10-CM

## 2022-02-14 DIAGNOSIS — H26492 Other secondary cataract, left eye: Secondary | ICD-10-CM | POA: Diagnosis not present

## 2022-02-14 DIAGNOSIS — H524 Presbyopia: Secondary | ICD-10-CM | POA: Diagnosis not present

## 2022-02-14 DIAGNOSIS — H43813 Vitreous degeneration, bilateral: Secondary | ICD-10-CM | POA: Diagnosis not present

## 2022-02-14 DIAGNOSIS — H04213 Epiphora due to excess lacrimation, bilateral lacrimal glands: Secondary | ICD-10-CM | POA: Diagnosis not present

## 2022-02-17 DIAGNOSIS — R1310 Dysphagia, unspecified: Secondary | ICD-10-CM | POA: Diagnosis not present

## 2022-02-17 DIAGNOSIS — K219 Gastro-esophageal reflux disease without esophagitis: Secondary | ICD-10-CM | POA: Diagnosis not present

## 2022-02-20 ENCOUNTER — Ambulatory Visit
Admission: RE | Admit: 2022-02-20 | Discharge: 2022-02-20 | Disposition: A | Discharge status: Home / Self Care | Payer: Medicare HMO | Source: Ambulatory Visit | Attending: Adult Health | Admitting: Adult Health

## 2022-02-20 DIAGNOSIS — Z1231 Encounter for screening mammogram for malignant neoplasm of breast: Secondary | ICD-10-CM

## 2022-02-24 DIAGNOSIS — K222 Esophageal obstruction: Secondary | ICD-10-CM | POA: Diagnosis not present

## 2022-02-24 DIAGNOSIS — R1310 Dysphagia, unspecified: Secondary | ICD-10-CM | POA: Diagnosis not present

## 2022-03-09 ENCOUNTER — Telehealth: Payer: Self-pay | Admitting: Adult Health

## 2022-03-09 NOTE — Telephone Encounter (Signed)
Pt notified to check with pharmacy.

## 2022-03-09 NOTE — Telephone Encounter (Signed)
Pt called to inform NP that she only has 2 pills left and would like to know if she can get a refill or does the NP want her to stop once she is done?  Please advise.   fenofibrate (TRICOR) 48 MG tablet  [id number] [place] [address]:  [phone] Fax:  [phone]

## 2022-04-04 DIAGNOSIS — R1310 Dysphagia, unspecified: Secondary | ICD-10-CM | POA: Diagnosis not present

## 2022-05-23 ENCOUNTER — Other Ambulatory Visit: Payer: Self-pay | Admitting: Adult Health

## 2022-05-26 DIAGNOSIS — Z20828 Contact with and (suspected) exposure to other viral communicable diseases: Secondary | ICD-10-CM | POA: Diagnosis not present

## 2022-06-13 DIAGNOSIS — H524 Presbyopia: Secondary | ICD-10-CM | POA: Diagnosis not present

## 2022-06-13 DIAGNOSIS — H5213 Myopia, bilateral: Secondary | ICD-10-CM | POA: Diagnosis not present

## 2022-06-13 DIAGNOSIS — H52209 Unspecified astigmatism, unspecified eye: Secondary | ICD-10-CM | POA: Diagnosis not present

## 2022-06-30 ENCOUNTER — Encounter: Payer: Self-pay | Admitting: Adult Health

## 2022-06-30 ENCOUNTER — Ambulatory Visit (INDEPENDENT_AMBULATORY_CARE_PROVIDER_SITE_OTHER): Payer: Medicare HMO | Admitting: Adult Health

## 2022-06-30 VITALS — BP 120/72 | HR 76 | Temp 98.1°F | Ht 65.75 in | Wt 174.0 lb

## 2022-06-30 DIAGNOSIS — G8929 Other chronic pain: Secondary | ICD-10-CM

## 2022-06-30 DIAGNOSIS — M25551 Pain in right hip: Secondary | ICD-10-CM

## 2022-06-30 NOTE — Progress Notes (Signed)
Subjective:    Patient ID: Angela Houston, female    DOB: 01-May-1951, 71 y.o.   MRN: [id number]  HPI 71 year old female who  has a past medical history of Diverticulitis, History of kidney stones, Hyperlipidemia, Hypothyroidism, Kidney stones, Mitral valve prolapse, Ovarian cyst, PONV (postoperative nausea and vomiting), and Thyroid disease.  She presents to the office today for for an acute issue of hip pain. She is concerned that she may have bursitis.   She reports that her right hip pain has been present for the last year. She originally thought that the pain was coming from her back but once she had her back taken care of the pain in the hip continued. Pain seems to be intermittent from day to day. Pain is felt like an ache. She has full ROM with the left hip and on days when she does have pain ROM does not cause worsening discomfort. Pain can radiate down her leg but usually stays in the hip   She uses voltaren which helps with the pain   Review of Systems See HPI   Past Medical History:  Diagnosis Date   Diverticulitis    History of kidney stones    Hyperlipidemia    Hypothyroidism    Kidney stones    Mitral valve prolapse    Ovarian cyst    PONV (postoperative nausea and vomiting)    Thyroid disease     Social History   Socioeconomic History   Marital status: Married    Spouse name: Not on file   Number of children: 3   Years of education: Not on file   Highest education level: Not on file  Occupational History   Occupation: housewife  Tobacco Use   Smoking status: Never   Smokeless tobacco: Never  Vaping Use   Vaping Use: Never used  Substance and Sexual Activity   Alcohol use: No   Drug use: No   Sexual activity: Not on file  Other Topics Concern   Not on file  Social History Narrative   Not on file   Social Determinants of Health   Financial Resource Strain: Low Risk  (10/14/2021)   Overall Financial Resource Strain (CARDIA)    Difficulty of Paying  Living Expenses: Not hard at all  Food Insecurity: No Food Insecurity (10/14/2021)   Hunger Vital Sign    Worried About Running Out of Food in the Last Year: Never true    Ran Out of Food in the Last Year: Never true  Transportation Needs: No Transportation Needs (10/14/2021)   PRAPARE - Hydrologist (Medical): No    Lack of Transportation (Non-Medical): No  Physical Activity: Sufficiently Active (10/14/2021)   Exercise Vital Sign    Days of Exercise per Week: 5 days    Minutes of Exercise per Session: 60 min  Stress: No Stress Concern Present (10/14/2021)   [place]    Feeling of Stress : Only a little  Social Connections: Socially Integrated (10/14/2021)   Social Connection and Isolation Panel [NHANES]    Frequency of Communication with Friends and Family: Not on file    Frequency of Social Gatherings with Friends and Family: More than three times a week    Attends Religious Services: More than 4 times per year    Active Member of [employer] or Organizations: Yes    Attends Archivist Meetings: More than 4 times per year  Marital Status: Married  Human resources officer Violence: Not At Risk (10/14/2021)   Humiliation, Afraid, Rape, and Kick questionnaire    Fear of Current or Ex-Partner: No    Emotionally Abused: No    Physically Abused: No    Sexually Abused: No    Past Surgical History:  Procedure Laterality Date   APPENDECTOMY     BREAST EXCISIONAL BIOPSY Right    CATARACT EXTRACTION, BILATERAL     COLONOSCOPY  2018   OVARIAN CYST REMOVAL     TONSILLECTOMY     TUBAL LIGATION      Family History  Problem Relation Age of Onset   COPD Father        smoker   Hyperlipidemia Father    Glaucoma Father    Heart disease Father    Breast cancer Sister    Colon cancer Neg Hx     Allergies  Allergen Reactions   Tizanidine Hcl Anaphylaxis   Sulfamethoxazole-Trimethoprim Rash and Other  (See Comments)    fever,headache,chills   Gabapentin     100 mg pt stated it caused confusion    Current Outpatient Medications on File Prior to Visit  Medication Sig Dispense Refill   atorvastatin (LIPITOR) 40 MG tablet TAKE 1 TABLET BY MOUTH EVERY DAY 90 tablet 2   b complex vitamins tablet Take 1 tablet by mouth daily.     Calcium Carbonate-Vit D-Min (CALCIUM 1200 PO) Take 1,200 mg by mouth daily.     Cholecalciferol (VITAMIN D) 2000 UNITS CAPS Take 2,000 Units by mouth daily.       CO-ENZYME Q-10 PO Take 100 mg by mouth daily.      Diclofenac Sodium (VOLTAREN EX) Apply topically. PRN     dicyclomine (BENTYL) 10 MG capsule Take 10 mg by mouth daily as needed for spasms.      dicyclomine (BENTYL) 10 MG capsule Take by mouth.     ELDERBERRY PO Take by mouth.     fenofibrate (TRICOR) 48 MG tablet Take 1 tablet (48 mg total) by mouth daily. 90 tablet 3   Flaxseed, Linseed, (FLAXSEED OIL) 1000 MG CAPS Take 1,000 mg by mouth daily.     hydroxypropyl methylcellulose / hypromellose (ISOPTO TEARS / GONIOVISC) 2.5 % ophthalmic solution Place 1 drop into both eyes 4 (four) times daily as needed for dry eyes.     levothyroxine (SYNTHROID) 100 MCG tablet Take 1 tablet (100 mcg total) by mouth daily. 90 tablet 3   Lutein 20 MG CAPS Take 20 mg by mouth daily.     Probiotic Product (PROBIOTIC DAILY PO) Take by mouth.     No current facility-administered medications on file prior to visit.    BP (!) 120/100   Pulse 76   Temp 98.1 F (36.7 C) (Oral)   Ht 5' 5.75" (1.67 m)   Wt 174 lb (78.9 kg)   SpO2 98%   BMI 28.30 kg/m       Objective:   Physical Exam Vitals and nursing note reviewed.  Constitutional:      Appearance: Normal appearance.  Musculoskeletal:        General: Normal range of motion.     Right hip: No deformity, tenderness, bony tenderness or crepitus. Normal range of motion. Normal strength.     Comments: Minor discomfort in right hip with internal rotation of leg.    Skin:    General: Skin is warm and dry.     Capillary Refill: Capillary refill takes less than 2 seconds.  Neurological:     General: No focal deficit present.     Mental Status: She is alert and oriented to person, place, and time.       Assessment & Plan:  1. Chronic right hip pain - Does not appear to be bursitis. Possible arthritis. She does not have any pain today  - Will get xray and consider referral to orthopedics - DG Hip Unilat W OR W/O Pelvis 2-3 Views Right; Future  Dorothyann Peng, NP

## 2022-06-30 NOTE — Patient Instructions (Signed)
It was great seeing you today   Please stop by the front desk and schedule an xray for Monday   We will follow up with you after we get the xray results back

## 2022-07-03 ENCOUNTER — Ambulatory Visit (INDEPENDENT_AMBULATORY_CARE_PROVIDER_SITE_OTHER): Payer: Medicare HMO

## 2022-07-03 ENCOUNTER — Other Ambulatory Visit: Payer: Medicare HMO

## 2022-07-03 DIAGNOSIS — G8929 Other chronic pain: Secondary | ICD-10-CM

## 2022-07-03 DIAGNOSIS — M25551 Pain in right hip: Secondary | ICD-10-CM | POA: Diagnosis not present

## 2022-07-10 DIAGNOSIS — Z85828 Personal history of other malignant neoplasm of skin: Secondary | ICD-10-CM | POA: Diagnosis not present

## 2022-07-10 DIAGNOSIS — D485 Neoplasm of uncertain behavior of skin: Secondary | ICD-10-CM | POA: Diagnosis not present

## 2022-07-10 DIAGNOSIS — L570 Actinic keratosis: Secondary | ICD-10-CM | POA: Diagnosis not present

## 2022-07-10 DIAGNOSIS — L814 Other melanin hyperpigmentation: Secondary | ICD-10-CM | POA: Diagnosis not present

## 2022-07-11 ENCOUNTER — Encounter: Payer: Self-pay | Admitting: *Deleted

## 2022-07-11 ENCOUNTER — Telehealth: Payer: Self-pay | Admitting: *Deleted

## 2022-07-11 NOTE — Patient Outreach (Signed)
  Care Coordination   Initial Visit Note   07/11/2022 Name: Angela Houston MRN: [id number] DOB: [birth year]  Angela Houston is a 71 y.o. year old female who sees Nafziger, Tommi Rumps, NP for primary care. I spoke with  Gordan Payment by phone today.  What matters to the patients health and wellness today?  No needs    Goals Addressed             This Visit's Progress    COMPLETED: No needs       Care Coordination Interventions: Reviewed medications with patient and discussed adherence with all medications with no needed refills Reviewed scheduled/upcoming provider appointments including pending appointments Screening for signs and symptoms of depression related to chronic disease state  Assessed social determinant of health barriers         SDOH assessments and interventions completed:  Yes  SDOH Interventions Today    Flowsheet Row Most Recent Value  SDOH Interventions   Food Insecurity Interventions Intervention Not Indicated  Housing Interventions Intervention Not Indicated  Transportation Interventions Intervention Not Indicated  Utilities Interventions Intervention Not Indicated        Care Coordination Interventions Activated:  Yes  Care Coordination Interventions:  Yes, provided   Follow up plan: No further intervention required.   Encounter Outcome:  Pt. Visit Completed   Raina Mina, RN Care Management Coordinator [place] Office [phone]

## 2022-07-11 NOTE — Patient Instructions (Signed)
Visit Information  Thank you for taking time to visit with me today. Please don't hesitate to contact me if I can be of assistance to you.   Following are the goals we discussed today:   Goals Addressed             This Visit's Progress    COMPLETED: No needs       Care Coordination Interventions: Reviewed medications with patient and discussed adherence with all medications with no needed refills Reviewed scheduled/upcoming provider appointments including pending appointments Screening for signs and symptoms of depression related to chronic disease state  Assessed social determinant of health barriers         Please call the care guide team at [phone] if you need to cancel or reschedule your appointment.   If you are experiencing a Mental Health or [place] or need someone to talk to, please call the Suicide and Crisis Lifeline: 988  Patient verbalizes understanding of instructions and care plan provided today and agrees to view in [place]. Active MyChart status and patient understanding of how to access instructions and care plan via MyChart confirmed with patient.     No further follow up required: No needs  Raina Mina, RN Care Management Coordinator [place] Office [phone]

## 2022-07-27 DIAGNOSIS — L570 Actinic keratosis: Secondary | ICD-10-CM | POA: Diagnosis not present

## 2022-09-14 DIAGNOSIS — H00012 Hordeolum externum right lower eyelid: Secondary | ICD-10-CM | POA: Diagnosis not present

## 2022-09-18 DIAGNOSIS — E755 Other lipid storage disorders: Secondary | ICD-10-CM | POA: Diagnosis not present

## 2022-10-16 ENCOUNTER — Ambulatory Visit (INDEPENDENT_AMBULATORY_CARE_PROVIDER_SITE_OTHER): Payer: Medicare HMO

## 2022-10-16 VITALS — Ht 66.0 in | Wt 174.0 lb

## 2022-10-16 DIAGNOSIS — Z0000 Encounter for general adult medical examination without abnormal findings: Secondary | ICD-10-CM | POA: Diagnosis not present

## 2022-10-16 NOTE — Patient Instructions (Addendum)
Ms. Toro , Thank you for taking time to come for your Medicare Wellness Visit. I appreciate your ongoing commitment to your health goals. Please review the following plan we discussed and let me know if I can assist you in the future.   These are the goals we discussed:  Goals       Increase physical activity (pt-stated)      Continue walking and keeping grandchildren.      Weight (lb) < 140 lb (63.5 kg)        This is a list of the screening recommended for you and due dates:  Health Maintenance  Topic Date Due   DTaP/Tdap/Td vaccine (2 - Td or Tdap) 12/04/2020   Flu Shot  12/10/2022*   Zoster (Shingles) Vaccine (1 of 2) 01/14/2023*   Medicare Annual Wellness Visit  10/17/2023   Mammogram  02/21/2024   Colon Cancer Screening  03/17/2025   Pneumonia Vaccine  Completed   DEXA scan (bone density measurement)  Completed   Hepatitis C Screening: USPSTF Recommendation to screen - Ages 34-79 yo.  Completed   HPV Vaccine  Aged Out   COVID-19 Vaccine  Discontinued  *Topic was postponed. The date shown is not the original due date.    Advanced directives: Please bring a copy of your health care power of attorney and living will to the office to be added to your chart at your convenience.   Conditions/risks identified: None  Next appointment: Follow up in one year for your annual wellness visit    Preventive Care 65 Years and Older, Female Preventive care refers to lifestyle choices and visits with your health care provider that can promote health and wellness. What does preventive care include? A yearly physical exam. This is also called an annual well check. Dental exams once or twice a year. Routine eye exams. Ask your health care provider how often you should have your eyes checked. Personal lifestyle choices, including: Daily care of your teeth and gums. Regular physical activity. Eating a healthy diet. Avoiding tobacco and drug use. Limiting alcohol use. Practicing safe  sex. Taking low-dose aspirin every day. Taking vitamin and mineral supplements as recommended by your health care provider. What happens during an annual well check? The services and screenings done by your health care provider during your annual well check will depend on your age, overall health, lifestyle risk factors, and family history of disease. Counseling  Your health care provider may ask you questions about your: Alcohol use. Tobacco use. Drug use. Emotional well-being. Home and relationship well-being. Sexual activity. Eating habits. History of falls. Memory and ability to understand (cognition). Work and work Statistician. Reproductive health. Screening  You may have the following tests or measurements: Height, weight, and BMI. Blood pressure. Lipid and cholesterol levels. These may be checked every 5 years, or more frequently if you are over 25 years old. Skin check. Lung cancer screening. You may have this screening every year starting at age 63 if you have a 30-pack-year history of smoking and currently smoke or have quit within the past 15 years. Fecal occult blood test (FOBT) of the stool. You may have this test every year starting at age 19. Flexible sigmoidoscopy or colonoscopy. You may have a sigmoidoscopy every 5 years or a colonoscopy every 10 years starting at age 53. Hepatitis C blood test. Hepatitis B blood test. Sexually transmitted disease (STD) testing. Diabetes screening. This is done by checking your blood sugar (glucose) after you have not eaten  for a while (fasting). You may have this done every 1-3 years. Bone density scan. This is done to screen for osteoporosis. You may have this done starting at age 9. Mammogram. This may be done every 1-2 years. Talk to your health care provider about how often you should have regular mammograms. Talk with your health care provider about your test results, treatment options, and if necessary, the need for more  tests. Vaccines  Your health care provider may recommend certain vaccines, such as: Influenza vaccine. This is recommended every year. Tetanus, diphtheria, and acellular pertussis (Tdap, Td) vaccine. You may need a Td booster every 10 years. Zoster vaccine. You may need this after age 61. Pneumococcal 13-valent conjugate (PCV13) vaccine. One dose is recommended after age 42. Pneumococcal polysaccharide (PPSV23) vaccine. One dose is recommended after age 79. Talk to your health care provider about which screenings and vaccines you need and how often you need them. This information is not intended to replace advice given to you by your health care provider. Make sure you discuss any questions you have with your health care provider. Document Released: 09/24/2015 Document Revised: 05/17/2016 Document Reviewed: 06/29/2015 Elsevier Interactive Patient Education  2017 [place] Prevention in the Home Falls can cause injuries. They can happen to people of all ages. There are many things you can do to make your home safe and to help prevent falls. What can I do on the outside of my home? Regularly fix the edges of walkways and driveways and fix any cracks. Remove anything that might make you trip as you walk through a door, such as a raised step or threshold. Trim any bushes or trees on the path to your home. Use bright outdoor lighting. Clear any walking paths of anything that might make someone trip, such as rocks or tools. Regularly check to see if handrails are loose or broken. Make sure that both sides of any steps have handrails. Any raised decks and porches should have guardrails on the edges. Have any leaves, snow, or ice cleared regularly. Use sand or salt on walking paths during winter. Clean up any spills in your garage right away. This includes oil or grease spills. What can I do in the bathroom? Use night lights. Install grab bars by the toilet and in the tub and shower.  Do not use towel bars as grab bars. Use non-skid mats or decals in the tub or shower. If you need to sit down in the shower, use a plastic, non-slip stool. Keep the floor dry. Clean up any water that spills on the floor as soon as it happens. Remove soap buildup in the tub or shower regularly. Attach bath mats securely with double-sided non-slip rug tape. Do not have throw rugs and other things on the floor that can make you trip. What can I do in the bedroom? Use night lights. Make sure that you have a light by your bed that is easy to reach. Do not use any sheets or blankets that are too big for your bed. They should not hang down onto the floor. Have a firm chair that has side arms. You can use this for support while you get dressed. Do not have throw rugs and other things on the floor that can make you trip. What can I do in the kitchen? Clean up any spills right away. Avoid walking on wet floors. Keep items that you use a lot in easy-to-reach places. If you need to reach something  above you, use a strong step stool that has a grab bar. Keep electrical cords out of the way. Do not use floor polish or wax that makes floors slippery. If you must use wax, use non-skid floor wax. Do not have throw rugs and other things on the floor that can make you trip. What can I do with my stairs? Do not leave any items on the stairs. Make sure that there are handrails on both sides of the stairs and use them. Fix handrails that are broken or loose. Make sure that handrails are as long as the stairways. Check any carpeting to make sure that it is firmly attached to the stairs. Fix any carpet that is loose or worn. Avoid having throw rugs at the top or bottom of the stairs. If you do have throw rugs, attach them to the floor with carpet tape. Make sure that you have a light switch at the top of the stairs and the bottom of the stairs. If you do not have them, ask someone to add them for you. What else  can I do to help prevent falls? Wear shoes that: Do not have high heels. Have rubber bottoms. Are comfortable and fit you well. Are closed at the toe. Do not wear sandals. If you use a stepladder: Make sure that it is fully opened. Do not climb a closed stepladder. Make sure that both sides of the stepladder are locked into place. Ask someone to hold it for you, if possible. Clearly mark and make sure that you can see: Any grab bars or handrails. First and last steps. Where the edge of each step is. Use tools that help you move around (mobility aids) if they are needed. These include: Canes. Walkers. Scooters. Crutches. Turn on the lights when you go into a dark area. Replace any light bulbs as soon as they burn out. Set up your furniture so you have a clear path. Avoid moving your furniture around. If any of your floors are uneven, fix them. If there are any pets around you, be aware of where they are. Review your medicines with your doctor. Some medicines can make you feel dizzy. This can increase your chance of falling. Ask your doctor what other things that you can do to help prevent falls. This information is not intended to replace advice given to you by your health care provider. Make sure you discuss any questions you have with your health care provider. Document Released: 06/24/2009 Document Revised: 02/03/2016 Document Reviewed: 10/02/2014 Elsevier Interactive Patient Education  2017 [employer].

## 2022-10-16 NOTE — Progress Notes (Signed)
Subjective:   Angela Houston is a 72 y.o. female who presents for Medicare Annual (Subsequent) preventive examination.  Review of Systems    Virtual Visit via Telephone Note  I connected with  Angela Houston on 10/16/22 at  9:15 AM EST by telephone and verified that I am speaking with the correct person using two identifiers.  Location: Patient: Home Provider: Office Persons participating in the virtual visit: patient/Nurse Health Advisor   I discussed the limitations, risks, security and privacy concerns of performing an evaluation and management service by telephone and the availability of in person appointments. The patient expressed understanding and agreed to proceed.  Interactive audio and video telecommunications were attempted between this nurse and patient, however failed, due to patient having technical difficulties OR patient did not have access to video capability.  We continued and completed visit with audio only.  Some vital signs may be absent or patient reported.   Criselda Peaches, LPN  Cardiac Risk Factors include: advanced age (>77mn, >>53women)     Objective:    Today's Vitals   10/16/22 0920  Weight: 174 lb (78.9 kg)  Height: '5\' 6"'$  (1.676 m)   Body mass index is 28.08 kg/m.     10/16/2022    9:28 AM 10/14/2021    8:41 AM 06/08/2020   10:44 AM 09/13/2018    8:59 AM  Advanced Directives  Does Patient Have a Medical Advance Directive? Yes Yes Yes Yes  Type of AParamed[place]of [place]yers Ranch ColonyLiving will [place] CityLiving will [place]una [place]Living will [place]North [place]Living will  Does patient want to make changes to medical advance directive?  No - Patient declined No - Patient declined   Copy of [place]in Chart? No - copy requested No - copy requested No - copy requested No - copy requested    Current Medications (verified) Outpatient Encounter Medications as of 10/16/2022   Medication Sig   atorvastatin (LIPITOR) 40 MG tablet TAKE 1 TABLET BY MOUTH EVERY DAY   b complex vitamins tablet Take 1 tablet by mouth daily.   Calcium Carbonate-Vit D-Min (CALCIUM 1200 PO) Take 1,200 mg by mouth daily.   Cholecalciferol (VITAMIN D) 2000 UNITS CAPS Take 2,000 Units by mouth daily.     CO-ENZYME Q-10 PO Take 100 mg by mouth daily.    Diclofenac Sodium (VOLTAREN EX) Apply topically. PRN   dicyclomine (BENTYL) 10 MG capsule Take 10 mg by mouth daily as needed for spasms.    dicyclomine (BENTYL) 10 MG capsule Take by mouth.   ELDERBERRY PO Take by mouth.   fenofibrate (TRICOR) 48 MG tablet Take 1 tablet (48 mg total) by mouth daily.   Flaxseed, Linseed, (FLAXSEED OIL) 1000 MG CAPS Take 1,000 mg by mouth daily.   hydroxypropyl methylcellulose / hypromellose (ISOPTO TEARS / GONIOVISC) 2.5 % ophthalmic solution Place 1 drop into both eyes 4 (four) times daily as needed for dry eyes.   levothyroxine (SYNTHROID) 100 MCG tablet Take 1 tablet (100 mcg total) by mouth daily.   Lutein 20 MG CAPS Take 20 mg by mouth daily.   Probiotic Product (PROBIOTIC DAILY PO) Take by mouth.   No facility-administered encounter medications on file as of 10/16/2022.    Allergies (verified) Tizanidine hcl, Sulfamethoxazole-trimethoprim, and Gabapentin   History: Past Medical History:  Diagnosis Date   Diverticulitis    History of kidney stones    Hyperlipidemia    Hypothyroidism  Kidney stones    Mitral valve prolapse    Ovarian cyst    PONV (postoperative nausea and vomiting)    Thyroid disease    Past Surgical History:  Procedure Laterality Date   APPENDECTOMY     BREAST EXCISIONAL BIOPSY Right    CATARACT EXTRACTION, BILATERAL     COLONOSCOPY  2018   OVARIAN CYST REMOVAL     TONSILLECTOMY     TUBAL LIGATION     Family History  Problem Relation Age of Onset   COPD Father        smoker   Hyperlipidemia Father    Glaucoma Father    Heart disease Father    Breast cancer  Sister    Colon cancer Neg Hx    Social History   Socioeconomic History   Marital status: Married    Spouse name: Not on file   Number of children: 3   Years of education: Not on file   Highest education level: Not on file  Occupational History   Occupation: housewife  Tobacco Use   Smoking status: Never   Smokeless tobacco: Never  Vaping Use   Vaping Use: Never used  Substance and Sexual Activity   Alcohol use: No   Drug use: No   Sexual activity: Not on file  Other Topics Concern   Not on file  Social History Narrative   Not on file   Social Determinants of Health   Financial Resource Strain: Low Risk  (10/16/2022)   Overall Financial Resource Strain (CARDIA)    Difficulty of Paying Living Expenses: Not hard at all  Food Insecurity: No Food Insecurity (10/16/2022)   Hunger Vital Sign    Worried About Running Out of Food in the Last Year: Never true    Ran Out of Food in the Last Year: Never true  Transportation Needs: No Transportation Needs (10/16/2022)   PRAPARE - Hydrologist (Medical): No    Lack of Transportation (Non-Medical): No  Physical Activity: Sufficiently Active (10/16/2022)   Exercise Vital Sign    Days of Exercise per Week: 5 days    Minutes of Exercise per Session: 60 min  Stress: No Stress Concern Present (10/16/2022)   [place]    Feeling of Stress : Not at all  Social Connections: [place] (10/16/2022)   Social Connection and Isolation Panel [NHANES]    Frequency of Communication with Friends and Family: More than three times a week    Frequency of Social Gatherings with Friends and Family: More than three times a week    Attends Religious Services: More than 4 times per year    Active Member of [employer] or Organizations: Yes    Attends Music therapist: More than 4 times per year    Marital Status: Married    Tobacco  Counseling Counseling given: Not Answered   Clinical Intake:  Pre-visit preparation completed: No  Pain : No/denies pain     BMI - recorded: 28.08 Nutritional Status: BMI 25 -29 Overweight Nutritional Risks: None Diabetes: No  How often do you need to have someone help you when you read instructions, pamphlets, or other written materials from your doctor or pharmacy?: 1 - Never  Diabetic? No  Interpreter Needed?: No  Information entered by :: Stacy Gardner LPN   Activities of Daily Living    10/16/2022    9:26 AM 12/30/2021    8:56 AM  In your present state of health, do you have any difficulty performing the following activities:  Hearing? 0 0  Vision? 0 0  Difficulty concentrating or making decisions? 0 0  Walking or climbing stairs? 0 0  Dressing or bathing? 0 0  Doing errands, shopping? 0 0  Preparing Food and eating ? N   Using the Toilet? N   In the past six months, have you accidently leaked urine? Y   Comment Wears pads. Followed by PCP   Do you have problems with loss of bowel control? N   Managing your Medications? N   Managing your Finances? N   Housekeeping or managing your Housekeeping? N     Patient Care Team: Dorothyann Peng, NP as PCP - General (Family Medicine) Almedia Balls, MD as Referring Physician (Orthopedic Surgery) Misenheimer, Christia Reading, MD as Consulting Physician (Gastroenterology) Jovita Gamma, MD as Consulting Physician (Neurosurgery)  Indicate any recent Medical Services you may have received from other than [facility] providers in the past year (date may be approximate).     Assessment:   This is a routine wellness examination for Angela Houston.  Hearing/Vision screen Hearing Screening - Comments:: Denies hearing difficulties   Vision Screening - Comments:: Wears rx glasses - up to date with routine eye exams with  Dr Satira Sark  Dietary issues and exercise activities discussed: Exercise limited by: None identified   Goals Addressed    None    Depression Screen    10/16/2022    9:25 AM 07/11/2022    2:02 PM 12/30/2021    8:55 AM 10/14/2021    8:36 AM 06/08/2020   10:46 AM 10/04/2017    3:35 PM 10/02/2016    8:54 AM  PHQ 2/9 Scores  PHQ - 2 Score 0 0 0 0 0 0 0  PHQ- 9 Score     0      Fall Risk    10/16/2022    9:27 AM 12/30/2021    8:55 AM 10/14/2021    8:39 AM 06/08/2020   10:45 AM 10/04/2017    3:35 PM  Fall Risk   Falls in the past year? 0 0 0  No  Number falls in past yr: 0 0 0 0   Injury with Fall? 0 0 0 0   Risk for fall due to : No Fall Risks No Fall Risks No Fall Risks No Fall Risks   Follow up Falls prevention discussed Falls evaluation completed  Falls evaluation completed;Falls prevention discussed     FALL RISK PREVENTION PERTAINING TO THE HOME:  Any stairs in or around the home? No  If so, are there any without handrails? No  Home free of loose throw rugs in walkways, pet beds, electrical cords, etc? Yes  Adequate lighting in your home to reduce risk of falls? Yes   ASSISTIVE DEVICES UTILIZED TO PREVENT FALLS:  Life alert? No  Use of a cane, walker or w/c? No  Grab bars in the bathroom? Yes  Shower chair or bench in shower? Yes  Elevated toilet seat or a handicapped toilet? Yes   TIMED UP AND GO:  Was the test performed? No . Audio Visit  Cognitive Function:        10/16/2022    9:28 AM 10/14/2021    8:41 AM 06/08/2020   11:00 AM  6CIT Screen  What Year? 0 points 0 points 0 points  What month? 0 points 0 points 0 points  What time? 0 points 0 points 0 points  Count back from 20 0 points 0 points 0 points  Months in reverse 0 points 0 points 0 points  Repeat phrase 0 points 0 points 0 points  Total Score 0 points 0 points 0 points    Immunizations Immunization History  Administered Date(s) Administered   Fluad Quad(high Dose 65+) 11/11/2019   Influenza Split 06/04/2012   Influenza Whole 07/26/2009   Influenza, High Dose Seasonal PF 09/27/2015, 10/02/2016, 10/04/2017, 10/24/2018    Influenza,inj,Quad PF,6+ Mos 07/07/2013, 07/08/2014   Moderna Sars-Covid-2 Vaccination 10/08/2019, 11/05/2019   Pneumococcal Conjugate-13 09/27/2015   Pneumococcal Polysaccharide-23 10/02/2016   Tdap 12/05/2010    TDAP status: Due, Education has been provided regarding the importance of this vaccine. Advised may receive this vaccine at local pharmacy or Health Dept. Aware to provide a copy of the vaccination record if obtained from local pharmacy or Health Dept. Verbalized acceptance and understanding.  Flu Vaccine status: Up to date  Pneumococcal vaccine status: Up to date  Covid-19 vaccine status: Completed vaccines  Qualifies for Shingles Vaccine? Yes   Zostavax completed No   Shingrix Completed?: No.    Education has been provided regarding the importance of this vaccine. Patient has been advised to call insurance company to determine out of pocket expense if they have not yet received this vaccine. Advised may also receive vaccine at local pharmacy or Health Dept. Verbalized acceptance and understanding.  Screening Tests Health Maintenance  Topic Date Due   DTaP/Tdap/Td (2 - Td or Tdap) 12/04/2020   INFLUENZA VACCINE  12/10/2022 (Originally 04/11/2022)   Zoster Vaccines- Shingrix (1 of 2) 01/14/2023 (Originally 09/19/2000)   Medicare Annual Wellness ([place])  10/17/2023   MAMMOGRAM  02/21/2024   COLONOSCOPY (Pts 45-46yr Insurance coverage will need to be confirmed)  03/17/2025   Pneumonia Vaccine 72 Years old  Completed   DEXA SCAN  Completed   Hepatitis C Screening  Completed   HPV VACCINES  Aged Out   COVID-19 Vaccine  Discontinued    Health Maintenance  Health Maintenance Due  Topic Date Due   DTaP/Tdap/Td (2 - Td or Tdap) 12/04/2020    Colorectal cancer screening: Type of screening: Colonoscopy. Completed 03/18/15. Repeat every 10 years  Mammogram status: Completed 02/20/22. Repeat every year  Bone Density status: Completed 10/05/20. Results reflect: Bone density  results: OSTEOPOROSIS. Repeat every   years.  Lung Cancer Screening: (Low Dose CT Chest recommended if Age 72-80years, 30 pack-year currently smoking OR have quit w/in 15years.) does not qualify.    Additional Screening:  Hepatitis C Screening: does qualify; Completed 11/19/20  Vision Screening: Recommended annual ophthalmology exams for early detection of glaucoma and other disorders of the eye. Is the patient up to date with their annual eye exam?  Yes  Who is the provider or what is the name of the office in which the patient attends annual eye exams? Dr TSatira SarkIf pt is not established with a provider, would they like to be referred to a provider to establish care? No .   Dental Screening: Recommended annual dental exams for proper oral hygiene  Community Resource Referral / Chronic Care Management:  CRR required this visit?  No   CCM required this visit?  No      Plan:     I have personally reviewed and noted the following in the patient's chart:   Medical and social history Use of alcohol, tobacco or illicit drugs  Current medications and supplements including opioid prescriptions. Patient is not currently taking opioid  prescriptions. Functional ability and status Nutritional status Physical activity Advanced directives List of other physicians Hospitalizations, surgeries, and ER visits in previous 12 months Vitals Screenings to include cognitive, depression, and falls Referrals and appointments  In addition, I have reviewed and discussed with patient certain preventive protocols, quality metrics, and best practice recommendations. A written personalized care plan for preventive services as well as general preventive health recommendations were provided to patient.     Criselda Peaches, LPN   11/12/8327   Nurse Notes: None

## 2022-10-20 DIAGNOSIS — R197 Diarrhea, unspecified: Secondary | ICD-10-CM | POA: Diagnosis not present

## 2023-01-08 ENCOUNTER — Other Ambulatory Visit: Payer: Self-pay | Admitting: Adult Health

## 2023-01-12 ENCOUNTER — Other Ambulatory Visit: Payer: Self-pay | Admitting: Adult Health

## 2023-01-12 ENCOUNTER — Ambulatory Visit (INDEPENDENT_AMBULATORY_CARE_PROVIDER_SITE_OTHER): Payer: Medicare HMO | Admitting: Adult Health

## 2023-01-12 ENCOUNTER — Encounter: Payer: Self-pay | Admitting: Adult Health

## 2023-01-12 VITALS — BP 120/88 | HR 73 | Temp 98.0°F | Ht 65.25 in | Wt 172.0 lb

## 2023-01-12 DIAGNOSIS — E782 Mixed hyperlipidemia: Secondary | ICD-10-CM

## 2023-01-12 DIAGNOSIS — E039 Hypothyroidism, unspecified: Secondary | ICD-10-CM

## 2023-01-12 DIAGNOSIS — Z0000 Encounter for general adult medical examination without abnormal findings: Secondary | ICD-10-CM

## 2023-01-12 DIAGNOSIS — M5126 Other intervertebral disc displacement, lumbar region: Secondary | ICD-10-CM | POA: Diagnosis not present

## 2023-01-12 LAB — COMPREHENSIVE METABOLIC PANEL
ALT: 24 U/L (ref 0–35)
AST: 33 U/L (ref 0–37)
Albumin: 4.4 g/dL (ref 3.5–5.2)
Alkaline Phosphatase: 141 U/L — ABNORMAL HIGH (ref 39–117)
BUN: 17 mg/dL (ref 6–23)
CO2: 29 mEq/L (ref 19–32)
Calcium: 10 mg/dL (ref 8.4–10.5)
Chloride: 104 mEq/L (ref 96–112)
Creatinine, Ser: 0.83 mg/dL (ref 0.40–1.20)
GFR: 70.48 mL/min (ref >60.00)
Glucose, Bld: 95 mg/dL (ref 70–99)
Potassium: 4.1 mEq/L (ref 3.5–5.1)
Sodium: 141 mEq/L (ref 135–145)
Total Bilirubin: 0.6 mg/dL (ref 0.2–1.2)
Total Protein: 7.4 g/dL (ref 6.0–8.3)

## 2023-01-12 LAB — LIPID PANEL
Cholesterol: 172 mg/dL (ref 0–200)
HDL: 57.6 mg/dL (ref >39.00)
LDL Cholesterol: 90 mg/dL (ref 0–99)
NonHDL: 114.31
Total CHOL/HDL Ratio: 3
Triglycerides: 121 mg/dL (ref 0.0–149.0)
VLDL: 24.2 mg/dL (ref 0.0–40.0)

## 2023-01-12 LAB — CBC WITH DIFFERENTIAL/PLATELET
Basophils Absolute: 0.1 10*3/uL (ref 0.0–0.1)
Basophils Relative: 0.9 % (ref 0.0–3.0)
Eosinophils Absolute: 0.1 10*3/uL (ref 0.0–0.7)
Eosinophils Relative: 2.1 % (ref 0.0–5.0)
HCT: 42.4 % (ref 36.0–46.0)
Hemoglobin: 14.5 g/dL (ref 12.0–15.0)
Lymphocytes Relative: 27.5 % (ref 12.0–46.0)
Lymphs Abs: 1.8 10*3/uL (ref 0.7–4.0)
MCHC: 34.2 g/dL (ref 30.0–36.0)
MCV: 89.4 fl (ref 78.0–100.0)
Monocytes Absolute: 0.5 10*3/uL (ref 0.1–1.0)
Monocytes Relative: 7.5 % (ref 3.0–12.0)
Neutro Abs: 4 10*3/uL (ref 1.4–7.7)
Neutrophils Relative %: 62 % (ref 43.0–77.0)
Platelets: 384 10*3/uL (ref 150.0–400.0)
RBC: 4.74 Mil/uL (ref 3.87–5.11)
RDW: 13.1 % (ref 11.5–15.5)
WBC: 6.4 10*3/uL (ref 4.0–10.5)

## 2023-01-12 LAB — TSH: TSH: 1.16 u[IU]/mL (ref 0.35–5.50)

## 2023-01-12 MED ORDER — FENOFIBRATE 48 MG PO TABS
48.0000 mg | ORAL_TABLET | Freq: Every day | ORAL | 3 refills | Status: DC
Start: 2023-01-12 — End: 2023-12-07

## 2023-01-12 MED ORDER — ATORVASTATIN CALCIUM 40 MG PO TABS
40.0000 mg | ORAL_TABLET | Freq: Every day | ORAL | 3 refills | Status: DC
Start: 2023-01-12 — End: 2024-01-30

## 2023-01-12 MED ORDER — LEVOTHYROXINE SODIUM 100 MCG PO TABS: 100.0000 ug | ORAL_TABLET | Freq: Every day | ORAL | 3 refills | Status: DC

## 2023-01-12 NOTE — Progress Notes (Signed)
Subjective:    Patient ID: JANIS DEVINCENTIS, female    DOB: 09-15-1950, 72 y.o.   MRN: [id number]  HPI Patient presents for yearly preventative medicine examination. He is a pleasant 72 year old female who  has a past medical history of Diverticulitis, History of kidney stones, Hyperlipidemia, Hypothyroidism, Kidney stones, Mitral valve prolapse, Ovarian cyst, PONV (postoperative nausea and vomiting), and Thyroid disease.  Hypothyroidism -managed with Synthroid 100 mcg daily.  She does feel controlled on this medication Lab Results  Component Value Date   TSH 0.27 (L) 12/30/2021   Hyperlipidemia-managed with Lipitor 40 mg daily and Tricor 48 mg daily.  She denies myalgia or fatigue Lab Results  Component Value Date   CHOL 173 12/30/2021   HDL 61.00 12/30/2021   LDLCALC 96 12/30/2021   LDLDIRECT 101.0 02/18/2020   TRIG 77.0 12/30/2021   CHOLHDL 3 12/30/2021   Lumbar radiculopathy-managed by [place] [facility]. She continues to have pain, threw her back out helping her elderly mother. She has an appointment with Neurosurgery next week.   All immunizations and health maintenance protocols were reviewed with the patient and needed orders were placed.  Appropriate screening laboratory values were ordered for the patient including screening of hyperlipidemia, renal function and hepatic function.  Medication reconciliation,  past medical history, social history, problem list and allergies were reviewed in detail with the patient  Goals were established with regard to weight loss, exercise, and  diet in compliance with medications  She is up to date one routine colon cancer screening as well as mammograms.   Review of Systems  Constitutional: Negative.   HENT: Negative.    Eyes: Negative.   Respiratory: Negative.    Cardiovascular: Negative.   Gastrointestinal: Negative.   Endocrine: Negative.   Genitourinary: Negative.   Musculoskeletal:  Positive for arthralgias and  back pain.  Skin: Negative.   Allergic/Immunologic: Negative.   Neurological: Negative.   Hematological: Negative.   Psychiatric/Behavioral: Negative.     Past Medical History:  Diagnosis Date   Diverticulitis    History of kidney stones    Hyperlipidemia    Hypothyroidism    Kidney stones    Mitral valve prolapse    Ovarian cyst    PONV (postoperative nausea and vomiting)    Thyroid disease     Social History   Socioeconomic History   Marital status: Married    Spouse name: Not on file   Number of children: 3   Years of education: Not on file   Highest education level: Not on file  Occupational History   Occupation: housewife  Tobacco Use   Smoking status: Never   Smokeless tobacco: Never  Vaping Use   Vaping Use: Never used  Substance and Sexual Activity   Alcohol use: No   Drug use: No   Sexual activity: Not on file  Other Topics Concern   Not on file  Social History Narrative   Not on file   Social Determinants of Health   Financial Resource Strain: Low Risk  (10/16/2022)   Overall Financial Resource Strain (CARDIA)    Difficulty of Paying Living Expenses: Not hard at all  Food Insecurity: No Food Insecurity (10/16/2022)   Hunger Vital Sign    Worried About Running Out of Food in the Last Year: Never true    Ran Out of Food in the Last Year: Never true  Transportation Needs: No Transportation Needs (10/16/2022)   PRAPARE - Transportation    Lack  of Transportation (Medical): No    Lack of Transportation (Non-Medical): No  Physical Activity: Sufficiently Active (10/16/2022)   Exercise Vital Sign    Days of Exercise per Week: 5 days    Minutes of Exercise per Session: 60 min  Stress: No Stress Concern Present (10/16/2022)   [employer] of Occupational Health - Occupational Stress Questionnaire    Feeling of Stress : Not at all  Social Connections: Socially Integrated (10/16/2022)   Social Connection and Isolation Panel [NHANES]    Frequency of  Communication with Friends and Family: More than three times a week    Frequency of Social Gatherings with Friends and Family: More than three times a week    Attends Religious Services: More than 4 times per year    Active Member of [employer] or Organizations: Yes    Attends Engineer, structural: More than 4 times per year    Marital Status: Married  Catering manager Violence: Not At Risk (10/16/2022)   Humiliation, Afraid, Rape, and Kick questionnaire    Fear of Current or Ex-Partner: No    Emotionally Abused: No    Physically Abused: No    Sexually Abused: No    Past Surgical History:  Procedure Laterality Date   APPENDECTOMY     BREAST EXCISIONAL BIOPSY Right    CATARACT EXTRACTION, BILATERAL     COLONOSCOPY  2018   OVARIAN CYST REMOVAL     TONSILLECTOMY     TUBAL LIGATION      Family History  Problem Relation Age of Onset   COPD Father        smoker   Hyperlipidemia Father    Glaucoma Father    Heart disease Father    Breast cancer Sister    Colon cancer Neg Hx     Allergies  Allergen Reactions   Tizanidine Hcl Anaphylaxis   Sulfamethoxazole-Trimethoprim Rash and Other (See Comments)    fever,headache,chills   Gabapentin     100 mg pt stated it caused confusion    Current Outpatient Medications on File Prior to Visit  Medication Sig Dispense Refill   atorvastatin (LIPITOR) 40 MG tablet TAKE 1 TABLET BY MOUTH EVERY DAY 90 tablet 2   b complex vitamins tablet Take 1 tablet by mouth daily.     Calcium Carbonate-Vit D-Min (CALCIUM 1200 PO) Take 1,200 mg by mouth daily.     Cholecalciferol (VITAMIN D) 2000 UNITS CAPS Take 2,000 Units by mouth daily.       CO-ENZYME Q-10 PO Take 100 mg by mouth daily.      Cyanocobalamin 1000 MCG/15ML LIQD Take by mouth.     Diclofenac Sodium (VOLTAREN EX) Apply topically. PRN     dicyclomine (BENTYL) 10 MG capsule Take 10 mg by mouth daily as needed for spasms.      dicyclomine (BENTYL) 10 MG capsule Take by mouth.      ELDERBERRY PO Take by mouth.     fenofibrate (TRICOR) 48 MG tablet Take 1 tablet (48 mg total) by mouth daily. 90 tablet 3   Flaxseed, Linseed, (FLAXSEED OIL) 1000 MG CAPS Take 1,000 mg by mouth daily.     hydroxypropyl methylcellulose / hypromellose (ISOPTO TEARS / GONIOVISC) 2.5 % ophthalmic solution Place 1 drop into both eyes 4 (four) times daily as needed for dry eyes.     levothyroxine (SYNTHROID) 100 MCG tablet TAKE 1 TABLET BY MOUTH EVERY DAY 30 tablet 0   Lutein 20 MG CAPS Take 20 mg by mouth  daily.     omeprazole (PRILOSEC) 40 MG capsule Take 40 mg by mouth every morning.     Probiotic Product (PROBIOTIC DAILY PO) Take by mouth.     No current facility-administered medications on file prior to visit.    BP 120/88   Pulse 73   Temp 98 F (36.7 C) (Oral)   Ht 5' 5.25" (1.657 m)   Wt 172 lb (78 kg)   SpO2 97%   BMI 28.40 kg/m       Objective:   Physical Exam Vitals and nursing note reviewed.  Constitutional:      General: She is not in acute distress.    Appearance: Normal appearance. She is not ill-appearing.  HENT:     Head: Normocephalic and atraumatic.     Right Ear: Tympanic membrane, ear canal and external ear normal. There is no impacted cerumen.     Left Ear: Tympanic membrane, ear canal and external ear normal. There is no impacted cerumen.     Nose: Nose normal. No congestion or rhinorrhea.     Mouth/Throat:     Mouth: Mucous membranes are moist.     Pharynx: Oropharynx is clear.  Eyes:     Extraocular Movements: Extraocular movements intact.     Conjunctiva/sclera: Conjunctivae normal.     Pupils: Pupils are equal, round, and reactive to light.  Neck:     Vascular: No carotid bruit.  Cardiovascular:     Rate and Rhythm: Normal rate and regular rhythm.     Pulses: Normal pulses.     Heart sounds: No murmur heard.    No friction rub. No gallop.  Pulmonary:     Effort: Pulmonary effort is normal.     Breath sounds: Normal breath sounds.  Abdominal:      General: Abdomen is flat. Bowel sounds are normal. There is no distension.     Palpations: Abdomen is soft. There is no mass.     Tenderness: There is no abdominal tenderness. There is no guarding or rebound.     Hernia: No hernia is present.  Musculoskeletal:        General: Normal range of motion.     Cervical back: Normal range of motion and neck supple.  Lymphadenopathy:     Cervical: No cervical adenopathy.  Skin:    General: Skin is warm and dry.     Capillary Refill: Capillary refill takes less than 2 seconds.  Neurological:     General: No focal deficit present.     Mental Status: She is alert and oriented to person, place, and time.  Psychiatric:        Mood and Affect: Mood normal.        Behavior: Behavior normal.        Thought Content: Thought content normal.        Judgment: Judgment normal.       Assessment & Plan:  1. Routine general medical examination at a health care facility Today patient counseled on age appropriate routine health concerns for screening and prevention, each reviewed and up to date or declined. Immunizations reviewed and up to date or declined. Labs ordered and reviewed. Risk factors for depression reviewed and negative. Hearing function and visual acuity are intact. ADLs screened and addressed as needed. Functional ability and level of safety reviewed and appropriate. Education, counseling and referrals performed based on assessed risks today. Patient provided with a copy of personalized plan for preventive services. - Follow up in one  year or sooner  - Advised to get shingles and tdap at pharmacy   2. Hypothyroidism, unspecified type - Consider dose change  - CBC with Differential/Platelet; Future - Comprehensive metabolic panel; Future - Lipid panel; Future - TSH; Future  3. Mixed hyperlipidemia -Consider increase in statin  - CBC with Differential/Platelet; Future - Comprehensive metabolic panel; Future - Lipid panel; Future -  TSH; Future  4. Lumbar disc herniation - Follow up with Neurosurgery next week   Shirline Frees, NP

## 2023-01-12 NOTE — Patient Instructions (Signed)
It was great seeing you today   We will follow up with you regarding your lab work   Please let me know if you need anything   

## 2023-01-15 DIAGNOSIS — M5136 Other intervertebral disc degeneration, lumbar region: Secondary | ICD-10-CM | POA: Diagnosis not present

## 2023-01-15 DIAGNOSIS — Z6826 Body mass index (BMI) 26.0-26.9, adult: Secondary | ICD-10-CM | POA: Diagnosis not present

## 2023-01-16 DIAGNOSIS — E785 Hyperlipidemia, unspecified: Secondary | ICD-10-CM | POA: Diagnosis not present

## 2023-01-16 DIAGNOSIS — M8580 Other specified disorders of bone density and structure, unspecified site: Secondary | ICD-10-CM | POA: Diagnosis not present

## 2023-01-16 DIAGNOSIS — R030 Elevated blood-pressure reading, without diagnosis of hypertension: Secondary | ICD-10-CM | POA: Diagnosis not present

## 2023-01-16 DIAGNOSIS — M5440 Lumbago with sciatica, unspecified side: Secondary | ICD-10-CM | POA: Diagnosis not present

## 2023-01-16 DIAGNOSIS — Z8249 Family history of ischemic heart disease and other diseases of the circulatory system: Secondary | ICD-10-CM | POA: Diagnosis not present

## 2023-01-16 DIAGNOSIS — M4800 Spinal stenosis, site unspecified: Secondary | ICD-10-CM | POA: Diagnosis not present

## 2023-01-16 DIAGNOSIS — K589 Irritable bowel syndrome without diarrhea: Secondary | ICD-10-CM | POA: Diagnosis not present

## 2023-01-16 DIAGNOSIS — Z823 Family history of stroke: Secondary | ICD-10-CM | POA: Diagnosis not present

## 2023-01-16 DIAGNOSIS — Z791 Long term (current) use of non-steroidal anti-inflammatories (NSAID): Secondary | ICD-10-CM | POA: Diagnosis not present

## 2023-01-16 DIAGNOSIS — M1990 Unspecified osteoarthritis, unspecified site: Secondary | ICD-10-CM | POA: Diagnosis not present

## 2023-01-16 DIAGNOSIS — K219 Gastro-esophageal reflux disease without esophagitis: Secondary | ICD-10-CM | POA: Diagnosis not present

## 2023-01-16 DIAGNOSIS — Z008 Encounter for other general examination: Secondary | ICD-10-CM | POA: Diagnosis not present

## 2023-01-16 DIAGNOSIS — Z803 Family history of malignant neoplasm of breast: Secondary | ICD-10-CM | POA: Diagnosis not present

## 2023-01-19 DIAGNOSIS — K219 Gastro-esophageal reflux disease without esophagitis: Secondary | ICD-10-CM | POA: Diagnosis not present

## 2023-01-19 DIAGNOSIS — E039 Hypothyroidism, unspecified: Secondary | ICD-10-CM | POA: Diagnosis not present

## 2023-01-19 DIAGNOSIS — R21 Rash and other nonspecific skin eruption: Secondary | ICD-10-CM | POA: Diagnosis not present

## 2023-01-19 DIAGNOSIS — E785 Hyperlipidemia, unspecified: Secondary | ICD-10-CM | POA: Diagnosis not present

## 2023-01-23 ENCOUNTER — Other Ambulatory Visit: Payer: Self-pay | Admitting: Adult Health

## 2023-01-23 DIAGNOSIS — Z1231 Encounter for screening mammogram for malignant neoplasm of breast: Secondary | ICD-10-CM

## 2023-01-30 DIAGNOSIS — M5136 Other intervertebral disc degeneration, lumbar region: Secondary | ICD-10-CM | POA: Diagnosis not present

## 2023-01-30 DIAGNOSIS — M5126 Other intervertebral disc displacement, lumbar region: Secondary | ICD-10-CM | POA: Diagnosis not present

## 2023-02-09 ENCOUNTER — Telehealth: Payer: Self-pay | Admitting: Adult Health

## 2023-02-09 DIAGNOSIS — M546 Pain in thoracic spine: Secondary | ICD-10-CM | POA: Diagnosis not present

## 2023-02-09 DIAGNOSIS — S22080A Wedge compression fracture of T11-T12 vertebra, initial encounter for closed fracture: Secondary | ICD-10-CM | POA: Diagnosis not present

## 2023-02-09 DIAGNOSIS — M8088XA Other osteoporosis with current pathological fracture, vertebra(e), initial encounter for fracture: Secondary | ICD-10-CM | POA: Diagnosis not present

## 2023-02-09 NOTE — Telephone Encounter (Signed)
Requesting bone density scan, fell and fractured C-12.

## 2023-02-13 ENCOUNTER — Other Ambulatory Visit: Payer: Self-pay | Admitting: Adult Health

## 2023-02-13 DIAGNOSIS — Z780 Asymptomatic menopausal state: Secondary | ICD-10-CM

## 2023-02-13 NOTE — Telephone Encounter (Signed)
Spoke with pt verbalized understanding of Cory advise 

## 2023-02-27 DIAGNOSIS — Z6828 Body mass index (BMI) 28.0-28.9, adult: Secondary | ICD-10-CM | POA: Diagnosis not present

## 2023-02-27 DIAGNOSIS — Z01419 Encounter for gynecological examination (general) (routine) without abnormal findings: Secondary | ICD-10-CM | POA: Diagnosis not present

## 2023-03-05 ENCOUNTER — Ambulatory Visit
Admission: RE | Admit: 2023-03-05 | Discharge: 2023-03-05 | Disposition: A | Discharge status: Home / Self Care | Payer: Medicare HMO | Source: Ambulatory Visit | Attending: Adult Health | Admitting: Adult Health

## 2023-03-05 DIAGNOSIS — Z1231 Encounter for screening mammogram for malignant neoplasm of breast: Secondary | ICD-10-CM

## 2023-03-15 ENCOUNTER — Other Ambulatory Visit: Payer: Self-pay | Admitting: Adult Health

## 2023-05-01 DIAGNOSIS — M8588 Other specified disorders of bone density and structure, other site: Secondary | ICD-10-CM | POA: Diagnosis not present

## 2023-05-01 LAB — HM DEXA SCAN

## 2023-06-04 DIAGNOSIS — M9902 Segmental and somatic dysfunction of thoracic region: Secondary | ICD-10-CM | POA: Diagnosis not present

## 2023-06-04 DIAGNOSIS — M9904 Segmental and somatic dysfunction of sacral region: Secondary | ICD-10-CM | POA: Diagnosis not present

## 2023-06-04 DIAGNOSIS — M9901 Segmental and somatic dysfunction of cervical region: Secondary | ICD-10-CM | POA: Diagnosis not present

## 2023-06-04 DIAGNOSIS — M9903 Segmental and somatic dysfunction of lumbar region: Secondary | ICD-10-CM | POA: Diagnosis not present

## 2023-06-05 DIAGNOSIS — M9903 Segmental and somatic dysfunction of lumbar region: Secondary | ICD-10-CM | POA: Diagnosis not present

## 2023-06-05 DIAGNOSIS — M9901 Segmental and somatic dysfunction of cervical region: Secondary | ICD-10-CM | POA: Diagnosis not present

## 2023-06-05 DIAGNOSIS — M9902 Segmental and somatic dysfunction of thoracic region: Secondary | ICD-10-CM | POA: Diagnosis not present

## 2023-06-05 DIAGNOSIS — M9904 Segmental and somatic dysfunction of sacral region: Secondary | ICD-10-CM | POA: Diagnosis not present

## 2023-06-11 DIAGNOSIS — M9902 Segmental and somatic dysfunction of thoracic region: Secondary | ICD-10-CM | POA: Diagnosis not present

## 2023-06-11 DIAGNOSIS — M9903 Segmental and somatic dysfunction of lumbar region: Secondary | ICD-10-CM | POA: Diagnosis not present

## 2023-06-11 DIAGNOSIS — M9904 Segmental and somatic dysfunction of sacral region: Secondary | ICD-10-CM | POA: Diagnosis not present

## 2023-06-11 DIAGNOSIS — M9901 Segmental and somatic dysfunction of cervical region: Secondary | ICD-10-CM | POA: Diagnosis not present

## 2023-06-22 DIAGNOSIS — H43822 Vitreomacular adhesion, left eye: Secondary | ICD-10-CM | POA: Diagnosis not present

## 2023-06-22 DIAGNOSIS — H43813 Vitreous degeneration, bilateral: Secondary | ICD-10-CM | POA: Diagnosis not present

## 2023-06-22 DIAGNOSIS — H52203 Unspecified astigmatism, bilateral: Secondary | ICD-10-CM | POA: Diagnosis not present

## 2023-06-22 DIAGNOSIS — H04123 Dry eye syndrome of bilateral lacrimal glands: Secondary | ICD-10-CM | POA: Diagnosis not present

## 2023-06-22 DIAGNOSIS — H26492 Other secondary cataract, left eye: Secondary | ICD-10-CM | POA: Diagnosis not present

## 2023-07-02 DIAGNOSIS — M9903 Segmental and somatic dysfunction of lumbar region: Secondary | ICD-10-CM | POA: Diagnosis not present

## 2023-07-02 DIAGNOSIS — M9901 Segmental and somatic dysfunction of cervical region: Secondary | ICD-10-CM | POA: Diagnosis not present

## 2023-07-02 DIAGNOSIS — M9904 Segmental and somatic dysfunction of sacral region: Secondary | ICD-10-CM | POA: Diagnosis not present

## 2023-07-02 DIAGNOSIS — M9902 Segmental and somatic dysfunction of thoracic region: Secondary | ICD-10-CM | POA: Diagnosis not present

## 2023-08-06 DIAGNOSIS — M9903 Segmental and somatic dysfunction of lumbar region: Secondary | ICD-10-CM | POA: Diagnosis not present

## 2023-08-06 DIAGNOSIS — M9902 Segmental and somatic dysfunction of thoracic region: Secondary | ICD-10-CM | POA: Diagnosis not present

## 2023-08-06 DIAGNOSIS — M9901 Segmental and somatic dysfunction of cervical region: Secondary | ICD-10-CM | POA: Diagnosis not present

## 2023-08-06 DIAGNOSIS — M9904 Segmental and somatic dysfunction of sacral region: Secondary | ICD-10-CM | POA: Diagnosis not present

## 2023-09-08 ENCOUNTER — Other Ambulatory Visit: Payer: Self-pay | Admitting: Adult Health

## 2023-09-11 DIAGNOSIS — M9902 Segmental and somatic dysfunction of thoracic region: Secondary | ICD-10-CM | POA: Diagnosis not present

## 2023-09-11 DIAGNOSIS — M9901 Segmental and somatic dysfunction of cervical region: Secondary | ICD-10-CM | POA: Diagnosis not present

## 2023-09-11 DIAGNOSIS — M9903 Segmental and somatic dysfunction of lumbar region: Secondary | ICD-10-CM | POA: Diagnosis not present

## 2023-09-11 DIAGNOSIS — M9904 Segmental and somatic dysfunction of sacral region: Secondary | ICD-10-CM | POA: Diagnosis not present

## 2023-09-24 IMAGING — MG MM DIGITAL SCREENING BILAT W/ TOMO AND CAD
6 of 10 series · 6 of 30 positions shown · non-contrast
Comparison: Previous exam(s).

CLINICAL DATA: Screening.

EXAM:
DIGITAL SCREENING BILATERAL MAMMOGRAM WITH TOMOSYNTHESIS AND CAD
TECHNIQUE: Bilateral screening digital craniocaudal and mediolateral oblique
mammograms were obtained. Bilateral screening digital breast
tomosynthesis was performed. The images were evaluated with
computer-aided detection.

[R MLO synth-2D]
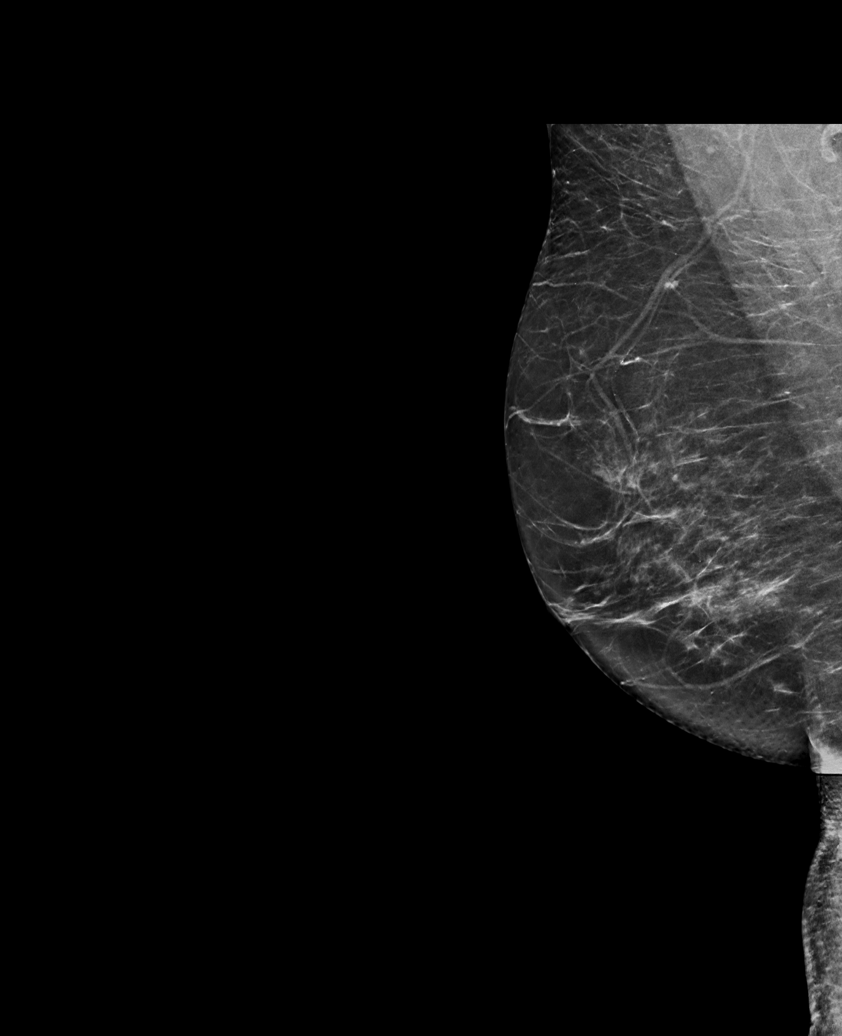

[L CC synth-2D]
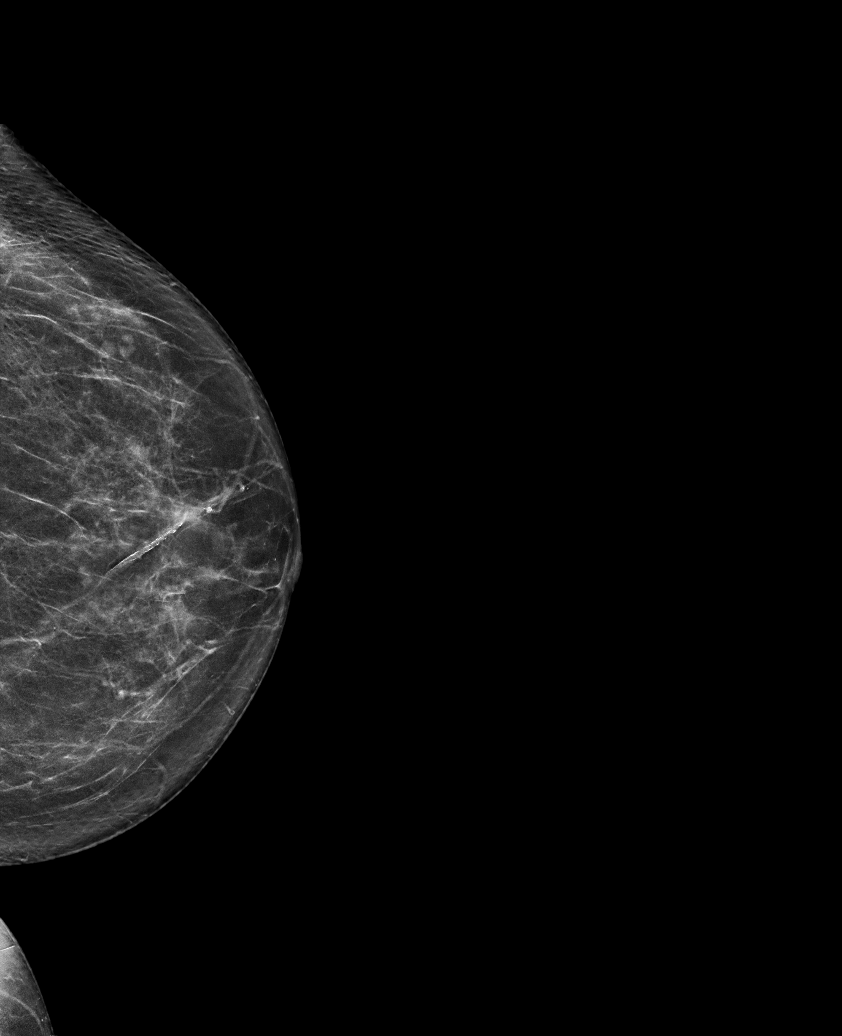

[R CC synth-2D (1 of 2)]
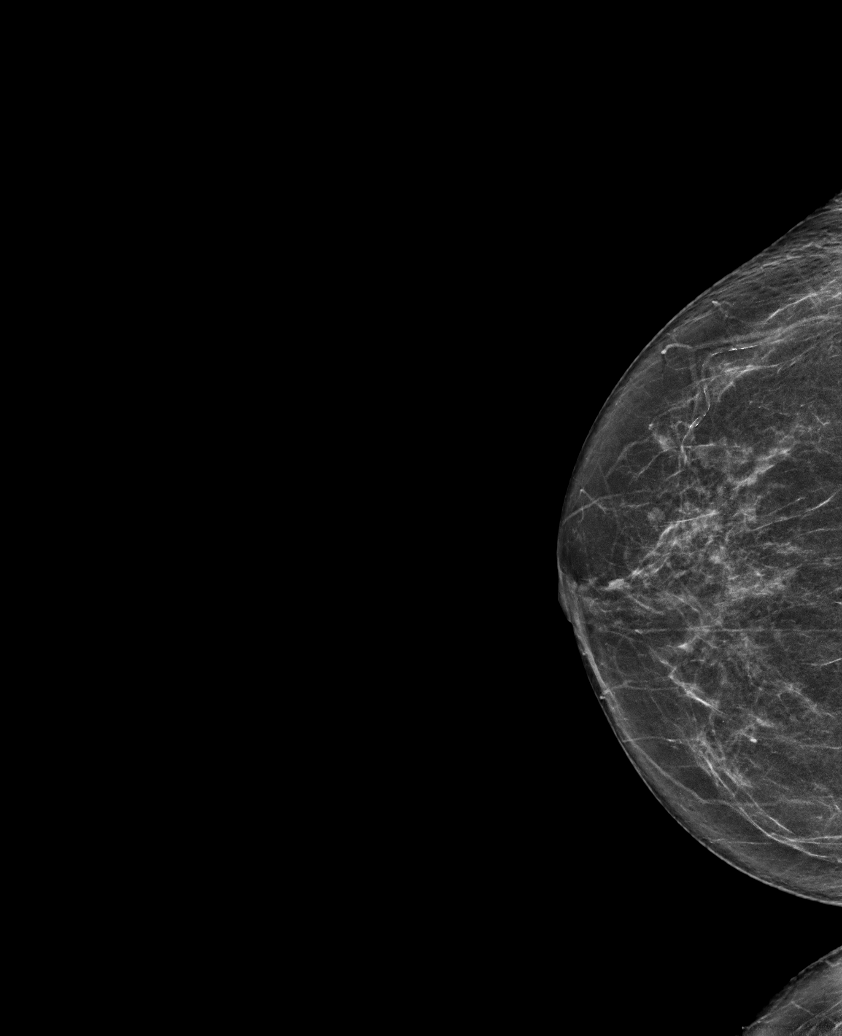

[L MLO synth-2D]
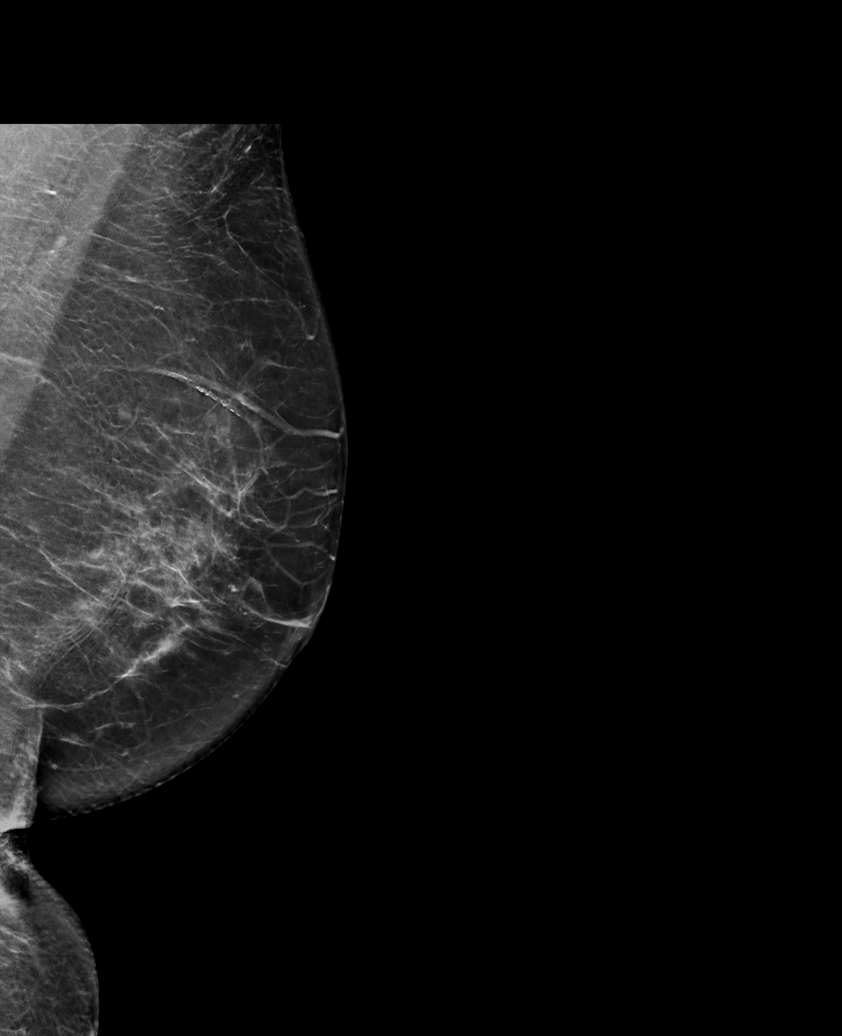

[R CC synth-2D (2 of 2)]
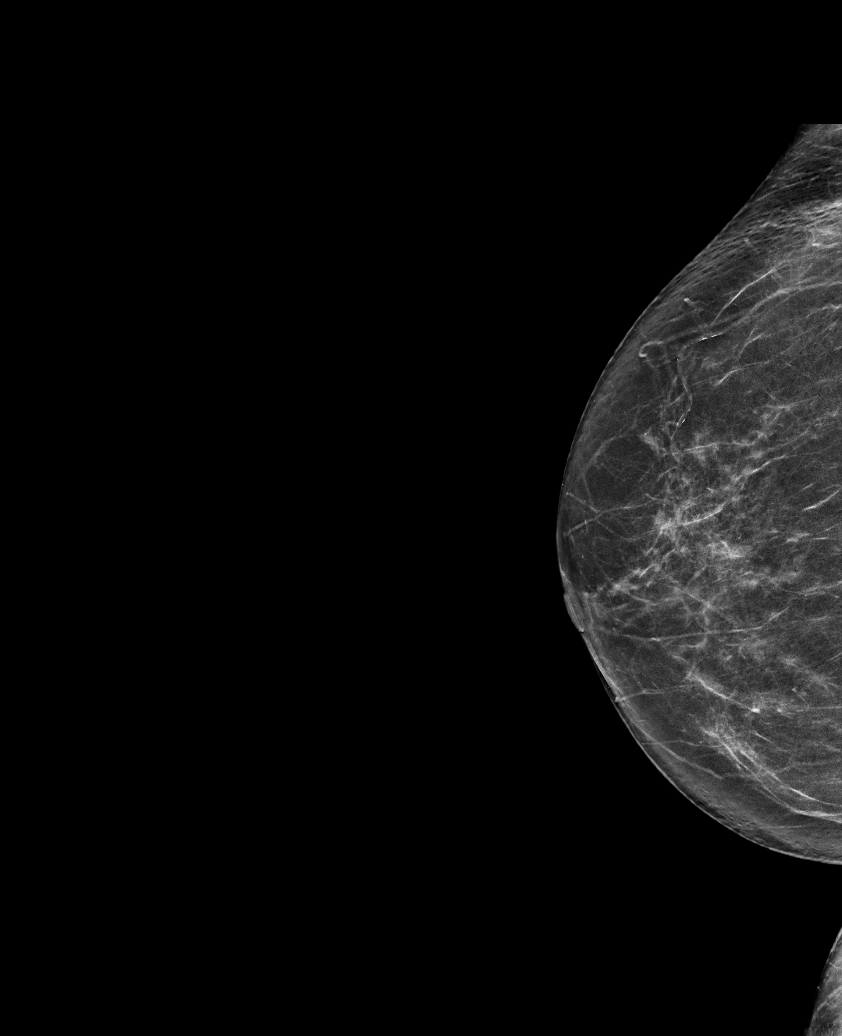

[R MLO tomo · tomo slice 37/73.0]
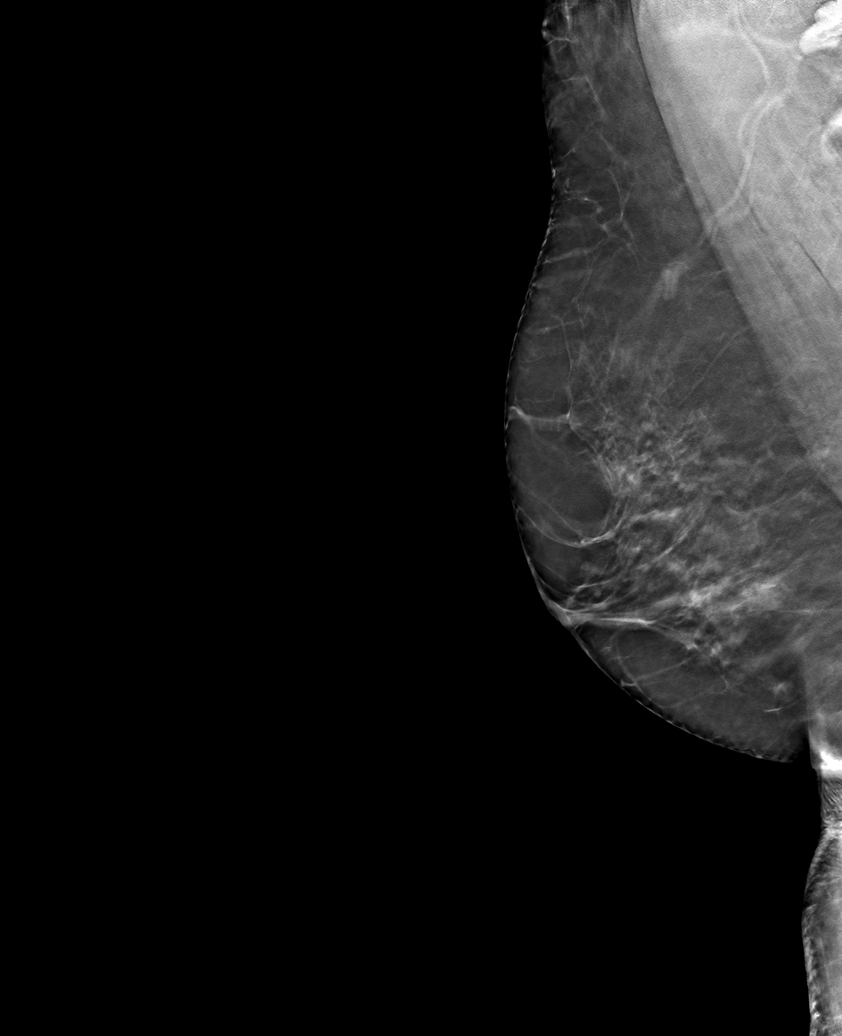

[6 of 30 positions shown; findings below may reference images not displayed]

ACR Breast Density Category b: There are scattered areas of
fibroglandular density.
FINDINGS: There are no findings suspicious for malignancy.
IMPRESSION: No mammographic evidence of malignancy. A result letter of this
screening mammogram will be mailed directly to the patient.

RECOMMENDATION:
Screening mammogram in one year. (Code:51-O-LD2)

BI-RADS CATEGORY  1: Negative.

## 2023-10-23 ENCOUNTER — Other Ambulatory Visit: Payer: Medicare HMO

## 2023-10-26 ENCOUNTER — Ambulatory Visit: Payer: Medicare HMO

## 2023-10-26 VITALS — Ht 65.25 in | Wt 178.0 lb

## 2023-10-26 DIAGNOSIS — Z0000 Encounter for general adult medical examination without abnormal findings: Secondary | ICD-10-CM | POA: Diagnosis not present

## 2023-10-26 NOTE — Progress Notes (Signed)
Subjective:   Angela Houston is a 73 y.o. female who presents for Medicare Annual (Subsequent) preventive examination.  Visit Complete: Virtual I connected with  Angela Houston on 10/26/23 by a audio enabled telemedicine application and verified that I am speaking with the correct person using two identifiers.  Patient Location: Home  Provider Location: Home Office  I discussed the limitations of evaluation and management by telemedicine. The patient expressed understanding and agreed to proceed.  Vital Signs: Because this visit was a virtual/telehealth visit, some criteria may be missing or patient reported. Any vitals not documented were not able to be obtained and vitals that have been documented are patient reported.   Cardiac Risk Factors include: advanced age (>37men, >50 women)     Objective:    Today's Vitals   10/26/23 0856  Weight: 178 lb (80.7 kg)  Height: 5' 5.25" (1.657 m)   Body mass index is 29.39 kg/m.     10/26/2023    9:02 AM 10/16/2022    9:28 AM 10/14/2021    8:41 AM 06/08/2020   10:44 AM 09/13/2018    8:59 AM  Advanced Directives  Does Patient Have a Medical Advance Directive? Yes Yes Yes Yes Yes  Type of Estate agent of [place];Living will Healthcare Power of [place];Living will Healthcare Power of [place];Living will Healthcare Power of [place];Living will Healthcare Power of [place];Living will  Does patient want to make changes to medical advance directive?   No - Patient declined No - Patient declined   Copy of Healthcare Power of Attorney in Chart? No - copy requested No - copy requested No - copy requested No - copy requested No - copy requested    Current Medications (verified) Outpatient Encounter Medications as of 10/26/2023  Medication Sig   atorvastatin (LIPITOR) 40 MG tablet Take 1 tablet (40 mg total) by mouth daily.   b complex vitamins tablet Take 1 tablet by mouth daily.   Calcium Carbonate-Vit D-Min (CALCIUM 1200  PO) Take 1,200 mg by mouth daily.   Cholecalciferol (VITAMIN D) 2000 UNITS CAPS Take 2,000 Units by mouth daily.     CO-ENZYME Q-10 PO Take 100 mg by mouth daily.    Cyanocobalamin 1000 MCG/15ML LIQD Take by mouth.   Diclofenac Sodium (VOLTAREN EX) Apply topically. PRN   dicyclomine (BENTYL) 10 MG capsule Take 10 mg by mouth daily as needed for spasms.    dicyclomine (BENTYL) 10 MG capsule Take by mouth.   ELDERBERRY PO Take by mouth.   fenofibrate (TRICOR) 48 MG tablet Take 1 tablet (48 mg total) by mouth daily.   Flaxseed, Linseed, (FLAXSEED OIL) 1000 MG CAPS Take 1,000 mg by mouth daily.   hydroxypropyl methylcellulose / hypromellose (ISOPTO TEARS / GONIOVISC) 2.5 % ophthalmic solution Place 1 drop into both eyes 4 (four) times daily as needed for dry eyes.   levothyroxine (SYNTHROID) 100 MCG tablet TAKE 1 TABLET BY MOUTH EVERY DAY   Lutein 20 MG CAPS Take 20 mg by mouth daily.   omeprazole (PRILOSEC) 40 MG capsule Take 40 mg by mouth every morning.   Probiotic Product (PROBIOTIC DAILY PO) Take by mouth.   No facility-administered encounter medications on file as of 10/26/2023.    Allergies (verified) Tizanidine hcl, Sulfamethoxazole-trimethoprim, and Gabapentin   History: Past Medical History:  Diagnosis Date   Diverticulitis    History of kidney stones    Hyperlipidemia    Hypothyroidism    Kidney stones    Mitral valve prolapse  Ovarian cyst    PONV (postoperative nausea and vomiting)    Thyroid disease    Past Surgical History:  Procedure Laterality Date   APPENDECTOMY     BREAST EXCISIONAL BIOPSY Right    CATARACT EXTRACTION, BILATERAL     COLONOSCOPY  2018   OVARIAN CYST REMOVAL     TONSILLECTOMY     TUBAL LIGATION     Family History  Problem Relation Age of Onset   COPD Father        smoker   Hyperlipidemia Father    Glaucoma Father    Heart disease Father    Breast cancer Sister    Colon cancer Neg Hx    Social History   Socioeconomic History    Marital status: Married    Spouse name: Not on file   Number of children: 3   Years of education: Not on file   Highest education level: Not on file  Occupational History   Occupation: housewife  Tobacco Use   Smoking status: Never   Smokeless tobacco: Never  Vaping Use   Vaping status: Never Used  Substance and Sexual Activity   Alcohol use: No   Drug use: No   Sexual activity: Not on file  Other Topics Concern   Not on file  Social History Narrative   Not on file   Social Drivers of Health   Financial Resource Strain: Low Risk  (10/26/2023)   Overall Financial Resource Strain (CARDIA)    Difficulty of Paying Living Expenses: Not hard at all  Food Insecurity: No Food Insecurity (10/26/2023)   Hunger Vital Sign    Worried About Running Out of Food in the Last Year: Never true    Ran Out of Food in the Last Year: Never true  Transportation Needs: No Transportation Needs (10/26/2023)   PRAPARE - Administrator, Civil Service (Medical): No    Lack of Transportation (Non-Medical): No  Physical Activity: Insufficiently Active (10/26/2023)   Exercise Vital Sign    Days of Exercise per Week: 3 days    Minutes of Exercise per Session: 30 min  Stress: No Stress Concern Present (10/26/2023)   [employer] of Occupational Health - Occupational Stress Questionnaire    Feeling of Stress : Not at all  Social Connections: Socially Integrated (10/26/2023)   Social Connection and Isolation Panel [NHANES]    Frequency of Communication with Friends and Family: More than three times a week    Frequency of Social Gatherings with Friends and Family: More than three times a week    Attends Religious Services: More than 4 times per year    Active Member of [employer] or Organizations: Yes    Attends Engineer, structural: More than 4 times per year    Marital Status: Married    Tobacco Counseling Counseling given: Not Answered   Clinical Intake:  Pre-visit preparation  completed: Yes  Pain : No/denies pain     BMI - recorded: 29.39 Nutritional Status: BMI 25 -29 Overweight Nutritional Risks: None Diabetes: No  How often do you need to have someone help you when you read instructions, pamphlets, or other written materials from your doctor or pharmacy?: 1 - Never  Interpreter Needed?: No  Information entered by :: Theresa Mulligan LPN   Activities of Daily Living    10/26/2023    9:00 AM  In your present state of health, do you have any difficulty performing the following activities:  Hearing? 0  Vision?  0  Difficulty concentrating or making decisions? 0  Walking or climbing stairs? 0  Dressing or bathing? 0  Doing errands, shopping? 0  Preparing Food and eating ? N  Using the Toilet? N  In the past six months, have you accidently leaked urine? N  Do you have problems with loss of bowel control? N  Managing your Medications? N  Managing your Finances? N  Housekeeping or managing your Housekeeping? N    Patient Care Team: Shirline Frees, NP as PCP - General (Family Medicine) Lunette Stands, MD as Referring Physician (Orthopedic Surgery) Misenheimer, Marcial Pacas, MD as Consulting Physician (Gastroenterology) Shirlean Kelly, MD as Consulting Physician (Neurosurgery)  Indicate any recent Medical Services you may have received from other than [facility] providers in the past year (date may be approximate).     Assessment:   This is a routine wellness examination for Glendi.  Hearing/Vision screen Hearing Screening - Comments:: Denies hearing difficulties   Vision Screening - Comments:: Wears rx glasses - up to date with routine eye exams with  Dr Burgess Estelle   Goals Addressed               This Visit's Progress     Get back to my normal (pt-stated)         Depression Screen    10/26/2023    9:00 AM 01/12/2023   10:43 AM 10/16/2022    9:25 AM 07/11/2022    2:02 PM 12/30/2021    8:55 AM 10/14/2021    8:36 AM 06/08/2020   10:46 AM  PHQ 2/9  Scores  PHQ - 2 Score 0 0 0 0 0 0 0  PHQ- 9 Score  0     0    Fall Risk    10/26/2023    9:01 AM 01/12/2023   10:43 AM 10/16/2022    9:27 AM 12/30/2021    8:55 AM 10/14/2021    8:39 AM  Fall Risk   Falls in the past year? 0 0 0 0 0  Number falls in past yr: 0 0 0 0 0  Injury with Fall? 0 0 0 0 0  Risk for fall due to : No Fall Risks No Fall Risks No Fall Risks No Fall Risks No Fall Risks  Follow up Falls prevention discussed;Falls evaluation completed Falls evaluation completed Falls prevention discussed Falls evaluation completed     MEDICARE RISK AT HOME: Medicare Risk at Home Any stairs in or around the home?: No If so, are there any without handrails?: No Home free of loose throw rugs in walkways, pet beds, electrical cords, etc?: Yes Adequate lighting in your home to reduce risk of falls?: Yes Life alert?: No Use of a cane, walker or w/c?: No Grab bars in the bathroom?: Yes Shower chair or bench in shower?: Yes Elevated toilet seat or a handicapped toilet?: Yes  TIMED UP AND GO:  Was the test performed?  No    Cognitive Function:        10/26/2023    9:02 AM 10/16/2022    9:28 AM 10/14/2021    8:41 AM 06/08/2020   11:00 AM  6CIT Screen  What Year? 0 points 0 points 0 points 0 points  What month? 0 points 0 points 0 points 0 points  What time? 0 points 0 points 0 points 0 points  Count back from 20 0 points 0 points 0 points 0 points  Months in reverse 0 points 0 points 0 points 0 points  Repeat  phrase 0 points 0 points 0 points 0 points  Total Score 0 points 0 points 0 points 0 points    Immunizations Immunization History  Administered Date(s) Administered   Fluad Quad(high Dose 65+) 11/11/2019   Influenza Split 07/26/2009, 06/04/2012   Influenza Whole 07/26/2009   Influenza, High Dose Seasonal PF 09/27/2015, 10/02/2016, 10/04/2017, 10/24/2018   Influenza,inj,Quad PF,6+ Mos 07/07/2013, 07/08/2014   Influenza-Unspecified 06/04/2012, 09/27/2015, 10/02/2016,  10/04/2017, 10/24/2018, 06/20/2021   Moderna Sars-Covid-2 Vaccination 10/08/2019, 11/05/2019   Pneumococcal Conjugate-13 09/27/2015   Pneumococcal Polysaccharide-23 10/02/2016   Tdap 12/05/2010    TDAP status: Due, Education has been provided regarding the importance of this vaccine. Advised may receive this vaccine at local pharmacy or Health Dept. Aware to provide a copy of the vaccination record if obtained from local pharmacy or Health Dept. Verbalized acceptance and understanding.  Flu Vaccine status: Due, Education has been provided regarding the importance of this vaccine. Advised may receive this vaccine at local pharmacy or Health Dept. Aware to provide a copy of the vaccination record if obtained from local pharmacy or Health Dept. Verbalized acceptance and understanding.  Pneumococcal vaccine status: Up to date    Qualifies for Shingles Vaccine? Yes   Zostavax completed No   Shingrix Completed?: No.    Education has been provided regarding the importance of this vaccine. Patient has been advised to call insurance company to determine out of pocket expense if they have not yet received this vaccine. Advised may also receive vaccine at local pharmacy or Health Dept. Verbalized acceptance and understanding.  Screening Tests Health Maintenance  Topic Date Due   Zoster Vaccines- Shingrix (1 of 2) Never done   DTaP/Tdap/Td (2 - Td or Tdap) 12/04/2020   INFLUENZA VACCINE  04/12/2023   Medicare Annual Wellness (AWV)  10/25/2024   MAMMOGRAM  03/04/2025   Colonoscopy  03/17/2025   Pneumonia Vaccine 21+ Years old  Completed   DEXA SCAN  Completed   Hepatitis C Screening  Completed   HPV VACCINES  Aged Out   COVID-19 Vaccine  Discontinued    Health Maintenance  Health Maintenance Due  Topic Date Due   Zoster Vaccines- Shingrix (1 of 2) Never done   DTaP/Tdap/Td (2 - Td or Tdap) 12/04/2020   INFLUENZA VACCINE  04/12/2023    Colorectal cancer screening: Type of screening:  Colonoscopy. Completed 03/18/15. Repeat every 10 years  Mammogram status: Completed 03/05/23. Repeat every year  Bone Density status: Completed 05/01/23. Results reflect: Bone density results: OSTEOPENIA. Repeat every   years.    Additional Screening:  Hepatitis C Screening: does qualify; Completed 11/19/20  Vision Screening: Recommended annual ophthalmology exams for early detection of glaucoma and other disorders of the eye. Is the patient up to date with their annual eye exam?  Yes  Who is the provider or what is the name of the office in which the patient attends annual eye exams? Dr Burgess Estelle If pt is not established with a provider, would they like to be referred to a provider to establish care? No .   Dental Screening: Recommended annual dental exams for proper oral hygiene    Community Resource Referral / Chronic Care Management:  CRR required this visit?  No   CCM required this visit?  No     Plan:     I have personally reviewed and noted the following in the patient's chart:   Medical and social history Use of alcohol, tobacco or illicit drugs  Current medications and supplements including  opioid prescriptions. Patient is not currently taking opioid prescriptions. Functional ability and status Nutritional status Physical activity Advanced directives List of other physicians Hospitalizations, surgeries, and ER visits in previous 12 months Vitals Screenings to include cognitive, depression, and falls Referrals and appointments  In addition, I have reviewed and discussed with patient certain preventive protocols, quality metrics, and best practice recommendations. A written personalized care plan for preventive services as well as general preventive health recommendations were provided to patient.     Tillie Rung, LPN   [id number]   After Visit Summary: (MyChart) Due to this being a telephonic visit, the after visit summary with patients personalized plan was  offered to patient via MyChart   Nurse Notes: None

## 2023-10-26 NOTE — Patient Instructions (Addendum)
Ms. Cogliano , Thank you for taking time to come for your Medicare Wellness Visit. I appreciate your ongoing commitment to your health goals. Please review the following plan we discussed and let me know if I can assist you in the future.   Referrals/Orders/Follow-Ups/Clinician Recommendations:   This is a list of the screening recommended for you and due dates:  Health Maintenance  Topic Date Due   Zoster (Shingles) Vaccine (1 of 2) Never done   DTaP/Tdap/Td vaccine (2 - Td or Tdap) 12/04/2020   Flu Shot  04/12/2023   Medicare Annual Wellness Visit  10/25/2024   Mammogram  03/04/2025   Colon Cancer Screening  03/17/2025   Pneumonia Vaccine  Completed   DEXA scan (bone density measurement)  Completed   Hepatitis C Screening  Completed   HPV Vaccine  Aged Out   COVID-19 Vaccine  Discontinued    Advanced directives: (Copy Requested) Please bring a copy of your health care power of attorney and living will to the office to be added to your chart at your convenience.  Next Medicare Annual Wellness Visit scheduled for next year: Yes

## 2023-12-07 ENCOUNTER — Other Ambulatory Visit: Payer: Self-pay | Admitting: Adult Health

## 2024-01-16 ENCOUNTER — Encounter: Payer: Medicare HMO | Admitting: Adult Health

## 2024-01-16 ENCOUNTER — Ambulatory Visit (INDEPENDENT_AMBULATORY_CARE_PROVIDER_SITE_OTHER): Payer: Medicare HMO | Admitting: Adult Health

## 2024-01-16 VITALS — BP 112/84 | HR 62 | Temp 98.1°F | Ht 66.25 in | Wt 178.0 lb

## 2024-01-16 DIAGNOSIS — Z0000 Encounter for general adult medical examination without abnormal findings: Secondary | ICD-10-CM

## 2024-01-16 DIAGNOSIS — E782 Mixed hyperlipidemia: Secondary | ICD-10-CM

## 2024-01-16 DIAGNOSIS — E039 Hypothyroidism, unspecified: Secondary | ICD-10-CM | POA: Diagnosis not present

## 2024-01-16 DIAGNOSIS — M5126 Other intervertebral disc displacement, lumbar region: Secondary | ICD-10-CM

## 2024-01-16 LAB — CBC WITH DIFFERENTIAL/PLATELET
Basophils Absolute: 0.1 10*3/uL (ref 0.0–0.1)
Basophils Relative: 1 % (ref 0.0–3.0)
Eosinophils Absolute: 0.1 10*3/uL (ref 0.0–0.7)
Eosinophils Relative: 2.1 % (ref 0.0–5.0)
HCT: 42.3 % (ref 36.0–46.0)
Hemoglobin: 14.5 g/dL (ref 12.0–15.0)
Lymphocytes Relative: 33.7 % (ref 12.0–46.0)
Lymphs Abs: 2.1 10*3/uL (ref 0.7–4.0)
MCHC: 34.2 g/dL (ref 30.0–36.0)
MCV: 91.4 fl (ref 78.0–100.0)
Monocytes Absolute: 0.6 10*3/uL (ref 0.1–1.0)
Monocytes Relative: 9 % (ref 3.0–12.0)
Neutro Abs: 3.3 10*3/uL (ref 1.4–7.7)
Neutrophils Relative %: 54.2 % (ref 43.0–77.0)
Platelets: 366 10*3/uL (ref 150.0–400.0)
RBC: 4.63 Mil/uL (ref 3.87–5.11)
RDW: 13.7 % (ref 11.5–15.5)
WBC: 6.1 10*3/uL (ref 4.0–10.5)

## 2024-01-16 LAB — COMPREHENSIVE METABOLIC PANEL WITH GFR
ALT: 29 U/L (ref 0–35)
AST: 32 U/L (ref 0–37)
Albumin: 4.5 g/dL (ref 3.5–5.2)
Alkaline Phosphatase: 73 U/L (ref 39–117)
BUN: 19 mg/dL (ref 6–23)
CO2: 24 meq/L (ref 19–32)
Calcium: 10 mg/dL (ref 8.4–10.5)
Chloride: 105 meq/L (ref 96–112)
Creatinine, Ser: 0.83 mg/dL (ref 0.40–1.20)
GFR: 69.98 mL/min (ref >60.00)
Glucose, Bld: 103 mg/dL — ABNORMAL HIGH (ref 70–99)
Potassium: 4.5 meq/L (ref 3.5–5.1)
Sodium: 140 meq/L (ref 135–145)
Total Bilirubin: 0.7 mg/dL (ref 0.2–1.2)
Total Protein: 7.2 g/dL (ref 6.0–8.3)

## 2024-01-16 LAB — LIPID PANEL
Cholesterol: 190 mg/dL (ref 0–200)
HDL: 59.8 mg/dL (ref >39.00)
LDL Cholesterol: 105 mg/dL — ABNORMAL HIGH (ref 0–99)
NonHDL: 130.23
Total CHOL/HDL Ratio: 3
Triglycerides: 128 mg/dL (ref 0.0–149.0)
VLDL: 25.6 mg/dL (ref 0.0–40.0)

## 2024-01-16 LAB — TSH: TSH: 0.79 u[IU]/mL (ref 0.35–5.50)

## 2024-01-16 NOTE — Progress Notes (Signed)
 Subjective:    Patient ID: Angela Houston, female    DOB: 1950-12-19, 73 y.o.   MRN: [id number]  HPI Patient presents for yearly preventative medicine examination. She is a pleasant 73 year old female who  has a past medical history of Diverticulitis, History of kidney stones, Hyperlipidemia, Hypothyroidism, Kidney stones, Mitral valve prolapse, Ovarian cyst, PONV (postoperative nausea and vomiting), and Thyroid  disease.  Hypothyroidism -managed with Synthroid  100 mcg daily.  She does feel controlled on this medication Lab Results  Component Value Date   TSH 1.16 01/12/2023   Hyperlipidemia-managed with Lipitor 40 mg daily and Tricor  48 mg daily.  She denies myalgia or fatigue Lab Results  Component Value Date   CHOL 172 01/12/2023   HDL 57.60 01/12/2023   LDLCALC 90 01/12/2023   LDLDIRECT 101.0 02/18/2020   TRIG 121.0 01/12/2023   CHOLHDL 3 01/12/2023   Lumbar radiculopathy-managed by [facility] as needed.  All immunizations and health maintenance protocols were reviewed with the patient and needed orders were placed. Advised to get shingles and tdap at pharmacy.   Appropriate screening laboratory values were ordered for the patient including screening of hyperlipidemia, renal function and hepatic function.  Medication reconciliation,  past medical history, social history, problem list and allergies were reviewed in detail with the patient  Goals were established with regard to weight loss, exercise, and  diet in compliance with medications  Wt Readings from Last 3 Encounters:  01/16/24 178 lb (80.7 kg)  10/26/23 178 lb (80.7 kg)  01/12/23 172 lb (78 kg)   She has no acute complaints today   She is up to date on routine colon cancer screening, mammograms and bone density screening.   Review of Systems  Constitutional: Negative.   HENT: Negative.    Eyes: Negative.   Respiratory: Negative.    Cardiovascular: Negative.   Gastrointestinal: Negative.    Endocrine: Negative.   Genitourinary: Negative.   Musculoskeletal:  Positive for arthralgias and back pain.  Skin: Negative.   Allergic/Immunologic: Negative.   Neurological: Negative.   Hematological: Negative.   Psychiatric/Behavioral: Negative.     Past Medical History:  Diagnosis Date   Diverticulitis    History of kidney stones    Hyperlipidemia    Hypothyroidism    Kidney stones    Mitral valve prolapse    Ovarian cyst    PONV (postoperative nausea and vomiting)    Thyroid  disease     Social History   Socioeconomic History   Marital status: Married    Spouse name: Not on file   Number of children: 3   Years of education: Not on file   Highest education level: Not on file  Occupational History   Occupation: housewife  Tobacco Use   Smoking status: Never   Smokeless tobacco: Never  Vaping Use   Vaping status: Never Used  Substance and Sexual Activity   Alcohol use: No   Drug use: No   Sexual activity: Not on file  Other Topics Concern   Not on file  Social History Narrative   Not on file   Social Drivers of Health   Financial Resource Strain: Low Risk  (10/26/2023)   Overall Financial Resource Strain (CARDIA)    Difficulty of Paying Living Expenses: Not hard at all  Food Insecurity: No Food Insecurity (10/26/2023)   Hunger Vital Sign    Worried About Running Out of Food in the Last Year: Never true    Ran Out of  Food in the Last Year: Never true  Transportation Needs: No Transportation Needs (10/26/2023)   PRAPARE - Administrator, Civil Service (Medical): No    Lack of Transportation (Non-Medical): No  Physical Activity: Insufficiently Active (10/26/2023)   Exercise Vital Sign    Days of Exercise per Week: 3 days    Minutes of Exercise per Session: 30 min  Stress: No Stress Concern Present (10/26/2023)   [employer] of Occupational Health - Occupational Stress Questionnaire    Feeling of Stress : Not at all  Social Connections:  Socially Integrated (10/26/2023)   Social Connection and Isolation Panel [NHANES]    Frequency of Communication with Friends and Family: More than three times a week    Frequency of Social Gatherings with Friends and Family: More than three times a week    Attends Religious Services: More than 4 times per year    Active Member of [employer] or Organizations: Yes    Attends Engineer, structural: More than 4 times per year    Marital Status: Married  Catering manager Violence: Not At Risk (10/26/2023)   Humiliation, Afraid, Rape, and Kick questionnaire    Fear of Current or Ex-Partner: No    Emotionally Abused: No    Physically Abused: No    Sexually Abused: No    Past Surgical History:  Procedure Laterality Date   APPENDECTOMY     BREAST EXCISIONAL BIOPSY Right    CATARACT EXTRACTION, BILATERAL     COLONOSCOPY  2018   OVARIAN CYST REMOVAL     TONSILLECTOMY     TUBAL LIGATION      Family History  Problem Relation Age of Onset   COPD Father        smoker   Hyperlipidemia Father    Glaucoma Father    Heart disease Father    Breast cancer Sister    Colon cancer Neg Hx     Allergies  Allergen Reactions   Tizanidine  Hcl Anaphylaxis   Sulfamethoxazole-Trimethoprim Rash and Other (See Comments)    fever,headache,chills   Gabapentin     100 mg pt stated it caused confusion    Current Outpatient Medications on File Prior to Visit  Medication Sig Dispense Refill   atorvastatin  (LIPITOR) 40 MG tablet Take 1 tablet (40 mg total) by mouth daily. 90 tablet 3   b complex vitamins tablet Take 1 tablet by mouth daily.     Calcium  Carbonate-Vit D-Min (CALCIUM  1200 PO) Take 1,200 mg by mouth daily.     Cholecalciferol (VITAMIN D ) 2000 UNITS CAPS Take 2,000 Units by mouth daily.       CO-ENZYME Q-10 PO Take 100 mg by mouth daily.      Cyanocobalamin 1000 MCG/15ML LIQD Take by mouth.     Diclofenac Sodium (VOLTAREN EX) Apply topically. PRN     dicyclomine  (BENTYL ) 10 MG capsule  Take 10 mg by mouth daily as needed for spasms.      dicyclomine  (BENTYL ) 10 MG capsule Take by mouth.     ELDERBERRY PO Take by mouth.     fenofibrate  (TRICOR ) 48 MG tablet TAKE 1 TABLET BY MOUTH EVERY DAY 90 tablet 0   Flaxseed, Linseed, (FLAXSEED OIL) 1000 MG CAPS Take 1,000 mg by mouth daily.     hydroxypropyl methylcellulose / hypromellose (ISOPTO TEARS / GONIOVISC) 2.5 % ophthalmic solution Place 1 drop into both eyes 4 (four) times daily as needed for dry eyes.     levothyroxine  (SYNTHROID ) 100  MCG tablet TAKE 1 TABLET BY MOUTH EVERY DAY 90 tablet 1   Lutein  20 MG CAPS Take 20 mg by mouth daily.     omeprazole (PRILOSEC) 40 MG capsule Take 40 mg by mouth every morning.     Probiotic Product (PROBIOTIC DAILY PO) Take by mouth.     No current facility-administered medications on file prior to visit.    BP 112/84   Pulse 62   Temp 98.1 F (36.7 C) (Oral)   Ht 5' 6.25" (1.683 m)   Wt 178 lb (80.7 kg)   SpO2 97%   BMI 28.51 kg/m       Objective:   Physical Exam Vitals and nursing note reviewed.  Constitutional:      General: She is not in acute distress.    Appearance: Normal appearance. She is not ill-appearing.  HENT:     Head: Normocephalic and atraumatic.     Right Ear: Tympanic membrane, ear canal and external ear normal. There is no impacted cerumen.     Left Ear: Tympanic membrane, ear canal and external ear normal. There is no impacted cerumen.     Nose: Nose normal. No congestion or rhinorrhea.     Mouth/Throat:     Mouth: Mucous membranes are moist.     Pharynx: Oropharynx is clear.  Eyes:     Extraocular Movements: Extraocular movements intact.     Conjunctiva/sclera: Conjunctivae normal.     Pupils: Pupils are equal, round, and reactive to light.  Neck:     Vascular: No carotid bruit.  Cardiovascular:     Rate and Rhythm: Normal rate and regular rhythm.     Pulses: Normal pulses.     Heart sounds: No murmur heard.    No friction rub. No gallop.   Pulmonary:     Effort: Pulmonary effort is normal.     Breath sounds: Normal breath sounds.  Abdominal:     General: Abdomen is flat. Bowel sounds are normal. There is no distension.     Palpations: Abdomen is soft. There is no mass.     Tenderness: There is no abdominal tenderness. There is no guarding or rebound.     Hernia: No hernia is present.  Musculoskeletal:        General: Normal range of motion.     Cervical back: Normal range of motion and neck supple.  Lymphadenopathy:     Cervical: No cervical adenopathy.  Skin:    General: Skin is warm and dry.     Capillary Refill: Capillary refill takes less than 2 seconds.  Neurological:     General: No focal deficit present.     Mental Status: She is alert and oriented to person, place, and time.  Psychiatric:        Mood and Affect: Mood normal.        Behavior: Behavior normal.        Thought Content: Thought content normal.        Judgment: Judgment normal.         Assessment & Plan:  1. Routine general medical examination at a health care facility (Primary) Today patient counseled on age appropriate routine health concerns for screening and prevention, each reviewed and up to date or declined. Immunizations reviewed and up to date or declined. Labs ordered and reviewed. Risk factors for depression reviewed and negative. Hearing function and visual acuity are intact. ADLs screened and addressed as needed. Functional ability and level of safety reviewed and appropriate.  Education, counseling and referrals performed based on assessed risks today. Patient provided with a copy of personalized plan for preventive services. - Follow up in one year or sooner if needed - Stay active and eat healthy   2. Hypothyroidism, unspecified type - Consider increase in synthroid   - CBC with Differential/Platelet; Future - Comprehensive metabolic panel with GFR; Future - Lipid panel; Future - TSH; Future  3. Mixed hyperlipidemia -  Consider increase in statin  - CBC with Differential/Platelet; Future - Comprehensive metabolic panel with GFR; Future - Lipid panel; Future - TSH; Future  4. Lumbar disc herniation - Per neurosurgery  - CBC with Differential/Platelet; Future - Comprehensive metabolic panel with GFR; Future - Lipid panel; Future - TSH; Future  Alto Atta, NP

## 2024-01-16 NOTE — Patient Instructions (Signed)
 It was great seeing you today   We will follow up with you regarding your lab work   Please let me know if you need anything

## 2024-01-29 ENCOUNTER — Other Ambulatory Visit: Payer: Self-pay | Admitting: Adult Health

## 2024-03-01 ENCOUNTER — Other Ambulatory Visit: Payer: Self-pay | Admitting: Adult Health

## 2024-05-20 ENCOUNTER — Other Ambulatory Visit: Payer: Self-pay | Admitting: Adult Health

## 2024-05-20 DIAGNOSIS — Z1231 Encounter for screening mammogram for malignant neoplasm of breast: Secondary | ICD-10-CM

## 2024-05-22 ENCOUNTER — Ambulatory Visit

## 2024-05-27 ENCOUNTER — Ambulatory Visit
Admission: RE | Admit: 2024-05-27 | Discharge: 2024-05-27 | Disposition: A | Discharge status: Home / Self Care | Source: Ambulatory Visit | Attending: Adult Health | Admitting: Adult Health

## 2024-05-27 DIAGNOSIS — Z1231 Encounter for screening mammogram for malignant neoplasm of breast: Secondary | ICD-10-CM

## 2024-05-28 ENCOUNTER — Other Ambulatory Visit: Payer: Self-pay | Admitting: Adult Health

## 2024-08-23 ENCOUNTER — Other Ambulatory Visit: Payer: Self-pay | Admitting: Adult Health

## 2024-10-31 ENCOUNTER — Ambulatory Visit: Payer: Medicare HMO
